# Patient Record
Sex: Female | Born: 1985 | Race: Asian | Hispanic: No | Marital: Married | Smoking: Never smoker
Health system: Southern US, Community
[De-identification: ages and names within clinical notes are randomized; demographics above are authoritative.]

## PROBLEM LIST (undated history)

## (undated) ENCOUNTER — Inpatient Hospital Stay (HOSPITAL_COMMUNITY): Payer: Self-pay

## (undated) DIAGNOSIS — Z789 Other specified health status: Secondary | ICD-10-CM

## (undated) DIAGNOSIS — E119 Type 2 diabetes mellitus without complications: Secondary | ICD-10-CM

## (undated) DIAGNOSIS — B54 Unspecified malaria: Secondary | ICD-10-CM

## (undated) DIAGNOSIS — O24419 Gestational diabetes mellitus in pregnancy, unspecified control: Secondary | ICD-10-CM

## (undated) HISTORY — PX: NO PAST SURGERIES: SHX2092

## (undated) HISTORY — DX: Unspecified malaria: B54

## (undated) HISTORY — DX: Type 2 diabetes mellitus without complications: E11.9

---
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
## 2013-12-26 ENCOUNTER — Ambulatory Visit: Payer: Self-pay | Attending: Family Medicine | Admitting: Family Medicine

## 2013-12-26 ENCOUNTER — Encounter: Payer: Self-pay | Admitting: Family Medicine

## 2013-12-26 VITALS — BP 110/72 | HR 66 | Temp 98.7°F | Resp 16 | Ht <= 58 in | Wt 112.0 lb

## 2013-12-26 DIAGNOSIS — M545 Low back pain, unspecified: Secondary | ICD-10-CM | POA: Insufficient documentation

## 2013-12-26 DIAGNOSIS — IMO0002 Reserved for concepts with insufficient information to code with codable children: Secondary | ICD-10-CM

## 2013-12-26 DIAGNOSIS — L989 Disorder of the skin and subcutaneous tissue, unspecified: Secondary | ICD-10-CM | POA: Insufficient documentation

## 2013-12-26 DIAGNOSIS — S76311A Strain of muscle, fascia and tendon of the posterior muscle group at thigh level, right thigh, initial encounter: Secondary | ICD-10-CM

## 2013-12-26 DIAGNOSIS — Z319 Encounter for procreative management, unspecified: Secondary | ICD-10-CM

## 2013-12-26 DIAGNOSIS — R109 Unspecified abdominal pain: Secondary | ICD-10-CM

## 2013-12-26 DIAGNOSIS — R1030 Lower abdominal pain, unspecified: Secondary | ICD-10-CM

## 2013-12-26 DIAGNOSIS — Z124 Encounter for screening for malignant neoplasm of cervix: Secondary | ICD-10-CM

## 2013-12-26 DIAGNOSIS — D1801 Hemangioma of skin and subcutaneous tissue: Secondary | ICD-10-CM

## 2013-12-26 MED ORDER — ACETAMINOPHEN 500 MG PO TABS: 500.0000 mg | ORAL_TABLET | Freq: Three times a day (TID) | ORAL | Status: DC | PRN

## 2013-12-26 MED ORDER — TRIAMCINOLONE ACETONIDE 0.1 % EX CREA: 1.0000 "application " | TOPICAL_CREAM | Freq: Two times a day (BID) | CUTANEOUS | Status: DC

## 2013-12-26 MED ORDER — PRENATAL VITAMIN 27-0.8 MG PO TABS: 1.0000 | ORAL_TABLET | Freq: Every day | ORAL | Status: DC

## 2013-12-26 NOTE — Assessment & Plan Note (Signed)
A: patient is healthy with healthy habits P: Prenatal vitamin Encouraged continued healthy habits

## 2013-12-26 NOTE — Progress Notes (Signed)
   Subjective:    Patient ID: Claire Hughes, female    DOB: 07/12/1985, 28 y.o.   MRN: [id number] CC: establish care, back pain, lower abdominal pain HPI 28 year old female with no significant past medical history presents to establish care discussed the following:  #1 lower back pain: Patient with low back pain since 2012 after the birth of her last son. Pain is right-sided. Pain radiates to the gluteal area, but not down the leg. There is no associated weakness, fecal or urinary incontinence or dysuria. Pain is exacerbated by running and heavy lifting. No treatment to date.  #2 lower abdominal pain: Patient with lower abdominal pains this was well after the birth of her last month. Pain is exacerbated by running in heavy lifting. Pain is slightly improved since onset. There is fever, no nausea, vomiting, diarrhea, constipation. Patient has a bowel movement daily. Patient is sexually active and desires pregnancy. Her last missed her period was 2 weeks ago and normal.  #3 left foot lesion: Patient with a lesion on her left foot that she noticed after leaving [place]. It is enlarging in size and pruritic. It is not painful. She denies trauma. She has no other lesions on her body.  Soc Hx: chronic non smoker  Review of Systems As per HPI      Objective:   Physical Exam BP 110/72  Pulse 66  Temp(Src) 98.7 F (37.1 C) (Oral)  Resp 16  Ht 4\' 9"  (1.448 m)  Wt 112 lb (50.803 kg)  BMI 24.23 kg/m2  SpO2 98%  LMP 12/13/2013 General appearance: alert, cooperative and no distress Abdomen: soft, non-tender; bowel sounds normal; no masses,  no organomegaly Pelvic: cervix normal in appearance, external genitalia normal, no adnexal masses or tenderness, no cervical motion tenderness, rectovaginal septum normal, uterus normal size, shape, and consistency and vagina normal without discharge Back Exam: Back: Normal Curvature, no deformities or CVA tenderness  Paraspinal Tenderness: absent   LE  Strength 5/5  LE Sensation: in tact  LE Reflexes 2+ and symmetric  Straight leg raise: negative  Skin: angiomata - feet left medial 10 mm x 7 mm      Assessment & Plan:

## 2013-12-26 NOTE — Patient Instructions (Addendum)
Claire Hughes,  Thank you for coming in today. Is a pleasure meeting you. I look forward to being a primary doctor.   The exam is normal. There is no problem with the position of uterus. Your back pain and muscle pain, muscle I suspect is the piriformis muscle. The muscle is strained.  Your abdominal and pelvic exam are normal. There is no hernia.  I I recommend strength training exercises for your abdomen to strengthen the supporting her back. You may take Tylenol as needed for pain if you desire pregnancy. Also be desire pregnancy I recommend starting prenatal vitamins before you  become pregnant.  Please see me as needed for persistent pain or the area on your L foot gets bigger or more itchy. Even with using the steroid cream.   Dr. Adrian Blackwater

## 2013-12-26 NOTE — Assessment & Plan Note (Signed)
A: pruritic lesion of L foot. Enlarging in size. P: Topical steroid cream

## 2013-12-26 NOTE — Assessment & Plan Note (Signed)
Pap and cervical cultures done today.

## 2013-12-26 NOTE — Progress Notes (Signed)
Pt is here to establish care. Pt reports having chronic lower back pain. Pt has a bump on the inside of her left foot.

## 2013-12-26 NOTE — Assessment & Plan Note (Signed)
A: history and exam consistent with performance muscle strain versus sacroiliac joint arthropathy. This is a chronic pain over the past 3 years. There are no red flags. P: Advised [place] exercises and Tylenol as needed for pain since patient desires pregnancy should avoid NSAIDs.

## 2013-12-26 NOTE — Assessment & Plan Note (Signed)
A: Chronic lower abdominal pain with a normal abdominal and pelvic exam. Pain appears to be muscle skeletal. There are no red flags. P: Tylenol as needed for pain recommend a core strengthening exercise.

## 2013-12-27 LAB — CYTOLOGY - PAP

## 2013-12-29 ENCOUNTER — Telehealth: Payer: Self-pay | Admitting: *Deleted

## 2013-12-29 NOTE — Telephone Encounter (Signed)
Pt is aware of her lab results.  

## 2013-12-29 NOTE — Telephone Encounter (Signed)
Message copied by Joan Mayans on Fri Dec 29, 2013  9:20 AM ------      Message from: Boykin Nearing      Created: Thu Dec 28, 2013  9:17 AM       Please inform patient.       Negative pap,      Repeat in 3 years ------

## 2014-03-06 ENCOUNTER — Encounter: Payer: Self-pay | Admitting: Family Medicine

## 2015-03-06 ENCOUNTER — Encounter (HOSPITAL_COMMUNITY): Payer: Self-pay

## 2015-03-06 ENCOUNTER — Inpatient Hospital Stay (HOSPITAL_COMMUNITY): Payer: Medicaid Other

## 2015-03-06 ENCOUNTER — Inpatient Hospital Stay (HOSPITAL_COMMUNITY)
Admission: AD | Admit: 2015-03-06 | Discharge: 2015-03-06 | Disposition: A | Discharge status: Home / Self Care | Payer: Medicaid Other | Source: Ambulatory Visit | Attending: Obstetrics and Gynecology | Admitting: Obstetrics and Gynecology

## 2015-03-06 DIAGNOSIS — O209 Hemorrhage in early pregnancy, unspecified: Secondary | ICD-10-CM

## 2015-03-06 DIAGNOSIS — O200 Threatened abortion: Secondary | ICD-10-CM | POA: Diagnosis present

## 2015-03-06 DIAGNOSIS — Z3A01 Less than 8 weeks gestation of pregnancy: Secondary | ICD-10-CM | POA: Insufficient documentation

## 2015-03-06 LAB — HCG, QUANTITATIVE, PREGNANCY: hCG, Beta Chain, Quant, S: 19112 m[IU]/mL — ABNORMAL HIGH (ref <5)

## 2015-03-06 LAB — CBC
HEMATOCRIT: 32.9 % — AB (ref 36.0–46.0)
Hemoglobin: 11.6 g/dL — ABNORMAL LOW (ref 12.0–15.0)
MCH: 29.2 pg (ref 26.0–34.0)
MCHC: 35.3 g/dL (ref 30.0–36.0)
MCV: 82.9 fL (ref 78.0–100.0)
Platelets: 233 10*3/uL (ref 150–400)
RBC: 3.97 MIL/uL (ref 3.87–5.11)
RDW: 12.6 % (ref 11.5–15.5)
WBC: 7.5 10*3/uL (ref 4.0–10.5)

## 2015-03-06 LAB — WET PREP, GENITAL
Clue Cells Wet Prep HPF POC: NONE SEEN
Trich, Wet Prep: NONE SEEN
YEAST WET PREP: NONE SEEN

## 2015-03-06 LAB — POCT PREGNANCY, URINE: PREG TEST UR: POSITIVE — AB

## 2015-03-06 LAB — ABO/RH: ABO/RH(D): B POS

## 2015-03-06 NOTE — MAU Provider Note (Signed)
History     CSN: [id number]  Arrival date and time: 03/06/15 1711   First Provider Initiated Contact with Patient 03/06/15 1734      Chief Complaint  Patient presents with  . Vaginal Bleeding   HPI  Claire Hughes is a 29 y.o. [id number] at [redacted]w[redacted]d who presents for vaginal bleeding.  vb x 2 weeks. Started out as brown spotting that turned to red and heavier. Today reports light spotting. Denies abdominal pain Denies n/v/d  Speaks Burmese, language line used  OB History    Gravida Para Term Preterm AB TAB SAB Ectopic Multiple Living   4    1  1   2       Past Medical History  Diagnosis Date  . Malaria 1994     as a child in [place]     No past surgical history on file.  Family History  Problem Relation Age of Onset  . Cancer Neg Hx     Social History  Substance Use Topics  . Smoking status: Never Smoker   . Smokeless tobacco: Never Used  . Alcohol Use: No    Allergies: No Known Allergies  Prescriptions prior to admission  Medication Sig Dispense Refill Last Dose  . acetaminophen (TYLENOL) 500 MG tablet Take 1 tablet (500 mg total) by mouth every 8 (eight) hours as needed for moderate pain. 30 tablet 0   . Prenatal Vit-Fe Fumarate-FA (PRENATAL VITAMIN) 27-0.8 MG TABS Take 1 tablet by mouth daily. 90 tablet 1   . triamcinolone cream (KENALOG) 0.1 % Apply 1 application topically 2 (two) times daily. Apply to L foot 30 g 0     Review of Systems  Constitutional: Negative.   Gastrointestinal: Negative.   Genitourinary:       + vaginal bleeding   Physical Exam   Blood pressure 112/64, pulse 87, resp. rate 18, height 4\' 8"  (1.422 m), weight 112 lb 9.6 oz (51.075 kg), last menstrual period 01/27/2015.  Physical Exam  Nursing note and vitals reviewed. Constitutional: She is oriented to person, place, and time. She appears well-developed and well-nourished. No distress.  HENT:  Head: Normocephalic and atraumatic.  Eyes: Conjunctivae are normal. Right eye  exhibits no discharge. Left eye exhibits no discharge. No scleral icterus.  Neck: Normal range of motion.  Cardiovascular: Normal rate, regular rhythm and normal heart sounds.   No murmur heard. Respiratory: Effort normal and breath sounds normal. No respiratory distress. She has no wheezes.  GI: Soft. Bowel sounds are normal. She exhibits no distension. There is no tenderness.  Neurological: She is alert and oriented to person, place, and time.  Skin: Skin is warm and dry. She is not diaphoretic.  Psychiatric: She has a normal mood and affect. Her behavior is normal. Judgment and thought content normal.    MAU Course  Procedures Results for orders placed or performed during the hospital encounter of 03/06/15 (from the past 24 hour(s))  Pregnancy, urine POC     Status: Abnormal   Collection Time: 03/06/15  5:33 PM  Result Value Ref Range   Preg Test, Ur POSITIVE (A) NEGATIVE  ABO/Rh     Status: None (Preliminary result)   Collection Time: 03/06/15  5:34 PM  Result Value Ref Range   ABO/RH(D) B POS   CBC     Status: Abnormal   Collection Time: 03/06/15  5:48 PM  Result Value Ref Range   WBC 7.5 4.0 - 10.5 K/uL   RBC 3.97 3.87 - 5.11 MIL/uL  Hemoglobin 11.6 (L) 12.0 - 15.0 g/dL   HCT 32.9 (L) 36.0 - 46.0 %   MCV 82.9 78.0 - 100.0 fL   MCH 29.2 26.0 - 34.0 pg   MCHC 35.3 30.0 - 36.0 g/dL   RDW 12.6 11.5 - 15.5 %   Platelets 233 150 - 400 K/uL  hCG, quantitative, pregnancy     Status: Abnormal   Collection Time: 03/06/15  5:48 PM  Result Value Ref Range   hCG, Beta Chain, Quant, S 19112 (H) <5 mIU/mL  Wet prep, genital     Status: Abnormal   Collection Time: 03/06/15  6:52 PM  Result Value Ref Range   Yeast Wet Prep HPF POC NONE SEEN NONE SEEN   Trich, Wet Prep NONE SEEN NONE SEEN   Clue Cells Wet Prep HPF POC NONE SEEN NONE SEEN   WBC, Wet Prep HPF POC FEW (A) NONE SEEN   US Ob Comp Less 14 Wks  03/06/2015  CLINICAL DATA:  Vaginal bleeding for 2 weeks EXAM: OBSTETRIC <14  WK [place] AND TRANSVAGINAL OB US TECHNIQUE: Both transabdominal and transvaginal ultrasound examinations were performed for complete evaluation of the gestation as well as the maternal uterus, adnexal regions, and pelvic cul-de-sac. Transvaginal technique was performed to assess early pregnancy. COMPARISON:  None. FINDINGS: Intrauterine gestational sac: Present, oval-shaped, and fundal. Yolk sac:  Present Embryo:  Present Cardiac Activity: Not visualized CRL:  3.8  mm   6 w   One d                  [place] EDC: 10/29/2014 Maternal uterus/adnexae: Ovaries are within normal limits. No pelvic mass. No free-fluid. No evidence of subchorionic hemorrhage. IMPRESSION: A single intrauterine gestation is present within a single normal-appearing gestational sac. The crown-rump length is only 3.8 mm. Fetal heart rate was not visualized. Although this can be a sign of fetal demise, fetal heart rate may not be visible by Doppler sonography until crown-rump length is greater than or equal to 7 mm. Short-term follow-up ultrasound, within 1 week is recommended to ensure normal development of the embryo and development of a fetal heart rate. Serial beta HCG levels are also recommended. Electronically Signed   By: Marybelle Killings M.D.   On: 03/06/2015 18:56   US Ob Transvaginal  03/06/2015  CLINICAL DATA:  Vaginal bleeding for 2 weeks EXAM: OBSTETRIC <14 WK [place] AND TRANSVAGINAL OB US TECHNIQUE: Both transabdominal and transvaginal ultrasound examinations were performed for complete evaluation of the gestation as well as the maternal uterus, adnexal regions, and pelvic cul-de-sac. Transvaginal technique was performed to assess early pregnancy. COMPARISON:  None. FINDINGS: Intrauterine gestational sac: Present, oval-shaped, and fundal. Yolk sac:  Present Embryo:  Present Cardiac Activity: Not visualized CRL:  3.8  mm   6 w   One d                  [place] EDC: 10/29/2014 Maternal uterus/adnexae: Ovaries are within normal limits. No pelvic mass. No  free-fluid. No evidence of subchorionic hemorrhage. IMPRESSION: A single intrauterine gestation is present within a single normal-appearing gestational sac. The crown-rump length is only 3.8 mm. Fetal heart rate was not visualized. Although this can be a sign of fetal demise, fetal heart rate may not be visible by Doppler sonography until crown-rump length is greater than or equal to 7 mm. Short-term follow-up ultrasound, within 1 week is recommended to ensure normal development of the embryo and development of a fetal heart rate.  Serial beta HCG levels are also recommended. Electronically Signed   By: Marybelle Killings M.D.   On: 03/06/2015 18:56    MDM Positive UPT CBC, BHCG, Abo/rh, GC/CT, wet prep Ultrasound - IUP with embryo, no cardiac activity, embryo too small for cardiac activity vs fetal demise. Radiologist recommends f/u ultrasound in 1 week to assess viability B positive Assessment and Plan  A: 1. Vaginal bleeding in pregnancy, first trimester   2. Threatened miscarriage in early pregnancy    P: Discharge home Threatened miscarriage instructions Discussed reasons to return Outpatient ultrasound in 1 week for viability Pregnancy verification letter provided  Jorje Guild, NP  03/06/2015, 5:44 PM

## 2015-03-06 NOTE — Discharge Instructions (Signed)
Threatened Miscarriage  A threatened miscarriage is when you have vaginal bleeding during your first 20 weeks of pregnancy but the pregnancy has not ended. Your doctor will do tests to make sure you are still pregnant. The cause of the bleeding may not be known. This condition does not mean your pregnancy will end. It does increase the risk of it ending (complete miscarriage).  HOME CARE    Make sure you keep all your doctor visits for prenatal care.   Get plenty of rest.   Do not have sex or use tampons if you have vaginal bleeding.   Do not douche.   Do not smoke or use drugs.   Do not drink alcohol.   Avoid caffeine.  GET HELP IF:   You have light bleeding from your vagina.   You have belly pain or cramping.   You have a fever.  GET HELP RIGHT AWAY IF:    You have heavy bleeding from your vagina.   You have clots of blood coming from your vagina.   You have bad pain or cramps in your low back or belly.   You have fever, chills, and bad belly pain.  MAKE SURE YOU:    Understand these instructions.   Will watch your condition.   Will get help right away if you are not doing well or get worse.     This information is not intended to replace advice given to you by your health care provider. Make sure you discuss any questions you have with your health care provider.     Document Released: 04/02/2008 Document Revised: 04/25/2013 Document Reviewed: 02/14/2013  Elsevier Interactive Patient Education 2016 [employer] Inc[employer]

## 2015-03-06 NOTE — MAU Note (Signed)
Patient presents with vaginal bleeding x2 weeks. Went to the clinic today and was told she was pregnant.

## 2015-03-07 LAB — HIV ANTIBODY (ROUTINE TESTING W REFLEX): HIV Screen 4th Generation wRfx: NONREACTIVE

## 2015-03-08 LAB — GC/CHLAMYDIA PROBE AMP (~~LOC~~) NOT AT ARMC
CHLAMYDIA, DNA PROBE: NEGATIVE
Neisseria Gonorrhea: NEGATIVE

## 2015-03-13 ENCOUNTER — Ambulatory Visit (HOSPITAL_COMMUNITY)
Admission: RE | Admit: 2015-03-13 | Discharge: 2015-03-13 | Disposition: A | Discharge status: Home / Self Care | Payer: Medicaid Other | Source: Ambulatory Visit | Attending: Student | Admitting: Student

## 2015-03-13 ENCOUNTER — Inpatient Hospital Stay (HOSPITAL_COMMUNITY)
Admission: AD | Admit: 2015-03-13 | Discharge: 2015-03-13 | Disposition: A | Discharge status: Home / Self Care | Payer: Medicaid Other | Source: Ambulatory Visit | Attending: Family Medicine | Admitting: Family Medicine

## 2015-03-13 DIAGNOSIS — O039 Complete or unspecified spontaneous abortion without complication: Secondary | ICD-10-CM | POA: Diagnosis not present

## 2015-03-13 DIAGNOSIS — O209 Hemorrhage in early pregnancy, unspecified: Secondary | ICD-10-CM

## 2015-03-13 DIAGNOSIS — N939 Abnormal uterine and vaginal bleeding, unspecified: Secondary | ICD-10-CM | POA: Diagnosis present

## 2015-03-13 DIAGNOSIS — O200 Threatened abortion: Secondary | ICD-10-CM

## 2015-03-13 NOTE — Discharge Instructions (Signed)
Miscarriage  A miscarriage is the sudden loss of an unborn baby (fetus) before the 20th week of pregnancy. Most miscarriages happen in the first 3 months of pregnancy. Sometimes, it happens before a woman even knows she is pregnant. A miscarriage is also called a "spontaneous miscarriage" or "early pregnancy loss." Having a miscarriage can be an emotional experience. Talk with your caregiver about any questions you may have about miscarrying, the grieving process, and your future pregnancy plans.  CAUSES    Problems with the fetal chromosomes that make it impossible for the baby to develop normally. Problems with the baby's genes or chromosomes are most often the result of errors that occur, by chance, as the embryo divides and grows. The problems are not inherited from the parents.   Infection of the cervix or uterus.    Hormone problems.    Problems with the cervix, such as having an incompetent cervix. This is when the tissue in the cervix is not strong enough to hold the pregnancy.    Problems with the uterus, such as an abnormally shaped uterus, uterine fibroids, or congenital abnormalities.    Certain medical conditions.    Smoking, drinking alcohol, or taking illegal drugs.    Trauma.   Often, the cause of a miscarriage is unknown.   SYMPTOMS    Vaginal bleeding or spotting, with or without cramps or pain.   Pain or cramping in the abdomen or lower back.   Passing fluid, tissue, or blood clots from the vagina.  DIAGNOSIS   Your caregiver will perform a physical exam. You may also have an ultrasound to confirm the miscarriage. Blood or urine tests may also be ordered.  TREATMENT    Sometimes, treatment is not necessary if you naturally pass all the fetal tissue that was in the uterus. If some of the fetus or placenta remains in the body (incomplete miscarriage), tissue left behind may become infected and must be removed. Usually, a dilation and curettage (D and C) procedure is performed.  During a D and C procedure, the cervix is widened (dilated) and any remaining fetal or placental tissue is gently removed from the uterus.   Antibiotic medicines are prescribed if there is an infection. Other medicines may be given to reduce the size of the uterus (contract) if there is a lot of bleeding.   If you have Rh negative blood and your baby was Rh positive, you will need a Rh immunoglobulin shot. This shot will protect any future baby from having Rh blood problems in future pregnancies.  HOME CARE INSTRUCTIONS    Your caregiver may order bed rest or may allow you to continue light activity. Resume activity as directed by your caregiver.   Have someone help with home and family responsibilities during this time.    Keep track of the number of sanitary pads you use each day and how soaked (saturated) they are. Write down this information.    Do not use tampons. Do not douche or have sexual intercourse until approved by your caregiver.    Only take over-the-counter or prescription medicines for pain or discomfort as directed by your caregiver.    Do not take aspirin. Aspirin can cause bleeding.    Keep all follow-up appointments with your caregiver.    If you or your partner have problems with grieving, talk to your caregiver or seek counseling to help cope with the pregnancy loss. Allow enough time to grieve before trying to get pregnant again.     SEEK IMMEDIATE MEDICAL CARE IF:    You have severe cramps or pain in your back or abdomen.   You have a fever.   You pass large blood clots (walnut-sized or larger) ortissue from your vagina. Save any tissue for your caregiver to inspect.    Your bleeding increases.    You have a thick, bad-smelling vaginal discharge.   You become lightheaded, weak, or you faint.    You have chills.   MAKE SURE YOU:   Understand these instructions.   Will watch your condition.   Will get help right away if you are not doing well or get worse.     This  information is not intended to replace advice given to you by your health care provider. Make sure you discuss any questions you have with your health care provider.     Document Released: 10/14/2000 Document Revised: 08/15/2012 Document Reviewed: 06/09/2011  Elsevier Interactive Patient Education 2016 [employer]

## 2015-03-13 NOTE — MAU Provider Note (Signed)
Chief Complaint: No chief complaint on file.   First Provider Initiated Contact with Patient 03/13/15 1551      SUBJECTIVE HPI: Claire Hughes is a 29 y.o. [id number] at 51w3dby LMP who presents to maternity admissions for results of her ultrasound followup.  States had heavy bleeding the day after her last visit.  Lasted for about a day then got better. Now only bleeding a little.  She denies pain, vaginal itching/burning, urinary symptoms, h/a, dizziness, n/v, or fever/chills.     HPI  Past Medical History  Diagnosis Date  . Malaria 1994     [place]   No past surgical history on file. Social History   Social History  . Marital Status: Married    Spouse Name: N/A  . Number of Children: 2  . Years of Education: N/A   Occupational History  . Not on file.   Social History Main Topics  . Smoking status: Never Smoker   . Smokeless tobacco: Never Used  . Alcohol Use: No  . Drug Use: No  . Sexual Activity: Not Currently     Comment: previous used pill and depo   Other Topics Concern  . Not on file   Social History Narrative   [place]    Has two children, boys.   Married   Desires pregnancy            No current facility-administered medications on file prior to encounter.   Current Outpatient Prescriptions on File Prior to Encounter  Medication Sig Dispense Refill  . acetaminophen (TYLENOL) 500 MG tablet Take 1 tablet (500 mg total) by mouth every 8 (eight) hours as needed for moderate pain. 30 tablet 0  . Prenatal Vit-Fe Fumarate-FA (PRENATAL VITAMIN) 27-0.8 MG TABS Take 1 tablet by mouth daily. 90 tablet 1   No Known Allergies  ROS:  Review of Systems Review of Systems  Constitutional: Negative for fever and chills.  Gastrointestinal: Negative for nausea, vomiting, abdominal pain, diarrhea and constipation.  Genitourinary: Negative for dysuria. Small bleeding Musculoskeletal: Negative for back pain.   Neurological: Negative for dizziness and weakness.    I have reviewed patient's Past Medical Hx, Surgical Hx, Family Hx, Social Hx, medications and allergies.   Physical Exam  No data found.  Constitutional: Well-developed, well-nourished female in no acute distress.  Cardiovascular: normal rate Respiratory: normal effort GI: Abd soft, non-tender. Pos BS x 4 MS: Extremities nontender, no edema, normal ROM Neurologic: Alert and oriented x 4.  GU: Neg CVAT.   Physical Exam LAB RESULTS No results found for this or any previous visit (from the past 24 hour(s)).  --/--/B POS (11/02 1734)  IMAGING [place] Comp Less 14 Wks  03/06/2015  CLINICAL DATA:  Vaginal bleeding for 2 weeks EXAM: OBSTETRIC <14 WK [place] TRANSVAGINAL OB [place]CHNIQUE: Both transabdominal and transvaginal ultrasound examinations were performed for complete evaluation of the gestation as well as the maternal uterus, adnexal regions, and pelvic cul-de-sac. Transvaginal technique was performed to assess early pregnancy. COMPARISON:  None. FINDINGS: Intrauterine gestational sac: Present, oval-shaped, and fundal. Yolk sac:  Present Embryo:  Present Cardiac Activity: Not visualized CRL:  3.8  mm   6 w   One d                  UKoreaEDC: 10/29/2014 Maternal uterus/adnexae: Ovaries are within normal limits. No pelvic mass. No free-fluid. No evidence of subchorionic hemorrhage. IMPRESSION:  A single intrauterine gestation is present within a single normal-appearing gestational sac. The crown-rump length is only 3.8 mm. Fetal heart rate was not visualized. Although this can be a sign of fetal demise, fetal heart rate may not be visible by Doppler sonography until crown-rump length is greater than or equal to 7 mm. Short-term follow-up ultrasound, within 1 week is recommended to ensure normal development of the embryo and development of a fetal heart rate. Serial beta HCG levels are also recommended. Electronically Signed   By: Marybelle Killings  M.D.   On: 03/06/2015 18:56   US Ob Transvaginal  03/13/2015  CLINICAL DATA:  Persistent vaginal bleeding EXAM: TRANSVAGINAL OB ULTRASOUND TECHNIQUE: Transvaginal ultrasound was performed for complete evaluation of the gestation as well as the maternal uterus, adnexal regions, and pelvic cul-de-sac. COMPARISON:  March 06, 2015 FINDINGS: Intrauterine gestational sac: No longer visualized Yolk sac:  Not visualized Embryo:  Not visualized Maternal uterus/adnexae: There is a small amount of fluid within the endometrium, likely hemorrhage. No retained products of conception seen. Uterus otherwise appears normal. There is no maternal extrauterine pelvic or adnexal mass. Right ovary measures 3.0 x 3.4 x 1.4 cm. Left ovary measures 2.6 x 1.9 x 3.3 cm. There is trace free pelvic fluid. IMPRESSION: Findings consistent with recent spontaneous abortion. Gestational sac is no longer appreciable. Probable mild hemorrhage within the endometrium. Uterus and endometrium otherwise unremarkable appearance. Trace free pelvic fluid is likely physiologic. There is no extrauterine pelvic mass. Electronically Signed   By: Lowella Grip III M.D.   On: 03/13/2015 15:38   US Ob Transvaginal  03/06/2015  CLINICAL DATA:  Vaginal bleeding for 2 weeks EXAM: OBSTETRIC <14 WK [place] AND TRANSVAGINAL OB US TECHNIQUE: Both transabdominal and transvaginal ultrasound examinations were performed for complete evaluation of the gestation as well as the maternal uterus, adnexal regions, and pelvic cul-de-sac. Transvaginal technique was performed to assess early pregnancy. COMPARISON:  None. FINDINGS: Intrauterine gestational sac: Present, oval-shaped, and fundal. Yolk sac:  Present Embryo:  Present Cardiac Activity: Not visualized CRL:  3.8  mm   6 w   One d                  [place] EDC: 10/29/2014 Maternal uterus/adnexae: Ovaries are within normal limits. No pelvic mass. No free-fluid. No evidence of subchorionic hemorrhage. IMPRESSION: A single  intrauterine gestation is present within a single normal-appearing gestational sac. The crown-rump length is only 3.8 mm. Fetal heart rate was not visualized. Although this can be a sign of fetal demise, fetal heart rate may not be visible by Doppler sonography until crown-rump length is greater than or equal to 7 mm. Short-term follow-up ultrasound, within 1 week is recommended to ensure normal development of the embryo and development of a fetal heart rate. Serial beta HCG levels are also recommended. Electronically Signed   By: Marybelle Killings M.D.   On: 03/06/2015 18:56    MAU Management/MDM: Reviewed ultrasound results.   Consult Dr Kennon Rounds.  No need for further HCG levels or ultrasounds  Pt stable at time of discharge. Will have clinic call her for post-SAB followup visit  Comfort kit given  ASSESSMENT 1. SAB (spontaneous abortion)     PLAN Discharge home   Medication List    TAKE these medications        acetaminophen 500 MG tablet  Commonly known as:  TYLENOL  Take 1 tablet (500 mg total) by mouth every 8 (eight) hours as needed for moderate  pain.     Prenatal Vitamin 27-0.8 MG Tabs  Take 1 tablet by mouth daily.           Follow-up Information    Follow up with [facility].   Why:  Someone from clinic will call with appointment   Contact information:   [place] [employer], MSN Certified Nurse-Midwife 03/13/2015  4:08 PM

## 2015-04-04 ENCOUNTER — Encounter: Payer: Self-pay | Admitting: Student

## 2015-04-04 ENCOUNTER — Ambulatory Visit (INDEPENDENT_AMBULATORY_CARE_PROVIDER_SITE_OTHER): Payer: Medicaid Other | Admitting: Student

## 2015-04-04 VITALS — BP 108/68 | HR 68 | Temp 98.5°F | Ht <= 58 in | Wt 111.9 lb

## 2015-04-04 DIAGNOSIS — O039 Complete or unspecified spontaneous abortion without complication: Secondary | ICD-10-CM | POA: Diagnosis present

## 2015-04-04 NOTE — Progress Notes (Signed)
Subjective:     Patient ID: Claire Hughes, female   DOB: 07/12/1985, 29 y.o.   MRN: [id number]  HPI Claire Hughes is a 29 y.o. female who presents for f/u SAB. Had complete SAB on 11/9. Denies abdominal pain, fever, or vaginal bleeding since last visit in MAU on 11/9.  Patient desires pregnancy. Has had 2 successful pregnancies & 2 miscarriages.   Review of Systems  Constitutional: Negative.   Gastrointestinal: Negative.   Genitourinary: Negative.        Objective:   Physical Exam  Constitutional: She appears well-developed and well-nourished. No distress.  Cardiovascular: Normal rate, regular rhythm and normal heart sounds.   Pulmonary/Chest: Effort normal and breath sounds normal. No respiratory distress. She has no wheezes.  Abdominal: Soft. Bowel sounds are normal. She exhibits no distension. There is no tenderness.  Skin: She is not diaphoretic.  Psychiatric: She has a normal mood and affect. Her behavior is normal. Judgment and thought content normal.   BP 108/68 mmHg  Pulse 68  Temp(Src) 98.5 F (36.9 C)  Ht 4\' 7"  (1.397 m)  Wt 111 lb 14.4 oz (50.758 kg)  BMI 26.01 kg/m2  LMP 03/27/2015 (Approximate)  Breastfeeding? No      Assessment:     Patient is in good health & spirits today. Expresses desire for future pregnancy. Answered all patient's questions through [place] interpreter at the bedside. Will collect BHCG to ensure that is has returned to normal. Patient denies intercourse since miscarriage.      Plan:     1. Spontaneous miscarriage  - B-HCG Quant      Patient to f/u as needed for routine gyn care.   Jorje Guild, NP

## 2015-04-04 NOTE — Patient Instructions (Signed)
S?y [place] (Miscarriage) S?y thai l [place] nhi ??t ng?t b? m?t tr??c tu?n th? 20 c?a [place] k?. H?u h?t cc tr??ng h?p s?y [place] x?y ra trong 3 thng ??u c?a [place] k?. ?i khi, n x?y ra tr??c khi ng??i ph? n? bi?t mnh c [place]. S?y thai cn ???c g?i l "s?y [place] t? nhin" hay "s?y thai s?m". S?y thai c th? ?nh h??ng ??n tnh c?m. Ni chuy?n v?i chuyn gia ch?m [place] s?c kh?e c?a b?n v? b?t k? cu h?i m b?n c th? c v? s?y [place], qu trnh ?au bu?n v k? ho?ch mang thai trong t??ng lai. NGUYN NHN  V?n ?? v?i cc nhi?m s?c th? c?a [place] nhi khi?n cho thai nhi khng th? pht tri?n bnh th??ng. V?n ?? v?i gen ho?c nhi?m s?c th? c?a em b th??ng l k?t qu? c?a cc sai st v tnh x?y ra khi phi thai phn chia v pht tri?n. Nh?ng v?n ?? ny khng ???c di truy?n t? cha m?.  Nhi?m trng c? t? cung ho?c t? cung.  V?n ?? v? hocmon.  V?n ?? v?i c? t? cung, ch?ng h?n nh? c c? t? cung y?u. ?i?u ny x?y ra khi m ? c? t? cung khng ?? m?nh ?? gi? [place].  V?n ?? v?i t? cung, ch?ng h?n nh? t? cung c hnh d?ng d? th??ng, u x? t? cung ho?c d? t?t b?m sinh.  M?t s? tnh tr?ng b?nh l nh?t ??nh.  Ht thu?c l, u?ng r??u ho?c dng ma ty b?t h?p php.  Ch?n th??ng. Nguyn nhn s?y thai th??ng khng xc ??nh ???c. TRI?U CH?NG  Ch?y mu ho?c ra mu t ? m ??o, b? ho?c khng b? co th?t ho?c ?au.  ?au ho?c co th?t trong b?ng ho?c ph?n l?ng d??i.  Ch?y d?ch, m ho?c c?c mu ?ng t? m ??o. CH?N ?ON Chuyn gia ch?m [place] s?c kh?e s? khm th?c th?. B?n c?ng c th? ???c siu m ?? xc nh?n s?y [place]. Xt nghi?m mu ho?c n??c ti?u c?ng c th? ???c yu c?u. ?I?U TR?  ?i khi, khng c?n ?i?u tr? n?u m bo [place] trong t? cung thot ra ngoi m?t cch t? nhin. N?u m?t ph?n bo [place] ho?c nhau thai v?n cn trong c? th? (s?y thai khng hon ton), m cn l?i c th? b? nhi?m trng v c?n ph?i ???c lo?i b?. Thng th??ng, th? thu?t nong v n?o (D v C) ???c th?c hi?n. Trong th? thu?t D v C, c? t? cung ???c m? r?ng (lm gin  ra) v m?i m bo [place] ho?c nhau thai cn l?i ???c nh? nhng l?y ra kh?i t? cung.  Thu?c khng sinh ???c k ??n n?u c nhi?m trng. Cc lo?i thu?c khc c th? ???c cho dng ?? gi?m kch th??c c?a t? cung (co l?i) n?u ch?y r?t nhi?u mu.  N?u mu b?n m tnh v?i Rh v em b c?a b?n d??ng tnh v?i Rh, b?n s? c?n tim globulin mi?n d?ch Rh. M?i tim ny s? b?o v? m?i em b c?a b?n trong t??ng lai khng b? cc v?n ?? v? mu Rh trong cc l?n mang thai trong t??ng lai. H??NG D?N CH?M [place] T?I NH  Chuyn gia ch?m [place] s?c kh?e c th? yu c?u b?n ngh? ng?i t?i gi??ng ho?c c th? cho php b?n ti?p t?c ho?t ??ng nh?Marland Kitchen Ti?p t?c ho?t ??ng theo ch? d?n c?a chuyn gia ch?m [place] s?c kh?e.  Nh? ai ? gip gi?i quy?t  cc trch nhi?m gia ?nh v vi?c nh trong th?i gian ny.  Theo di s? l??ng b?ng v? sinh b?n s? d?ng m?i ngy v m?c ?? ng?m (th?m ??m)c?a chng. Ghi l?i thng tin ny.  Khng dng cu?n b?ng v? sinh. Khng th?t r?a m ??o ho?c quan h? tnh d?c cho ??n khi ???c ch?p thu?n b?i chuyn gia ch?m [place] s?c kh?e.  Ch? s? d?ng thu?c khng c?n k toa ho?c thu?c c?n k toa ?? gi?m ?au ho?c gi?m c?m gic kh ch?u theo ch? d?n c?a chuyn gia ch?m [place] s?c kh?e c?a b?n.  Khng dng aapirin. Aspirin c th? gy ch?y mu.  Tun th? t?t c? cc cu?c h?n khm l?i v?i chuyn gia ch?m [place] s?c kh?e c?a b?n.  N?u b?n ho?c b?n tnh c?a b?n ?au bu?n qu m?c, hy ni chuy?n v?i chuyn gia ch?m [place] s?c kh?e c?a b?n ho?c tm ki?m s? t? v?n ?? gip ??i ph v?i vi?c s?y [place]. Cho php ?? th?i gian ?? s? ?au bu?n v?i ?i tr??c khi c? g?ng c thai l?i. HY NGAY L?P T?C ?I KHM N?U:  B?n b? co th?t n?ng ho?c ?au n?ng ? l?ng ho?c b?ng.  B?n b? s?t.  C c?c ?ng mu (kch th??c b?ng h?t d? ho?c l?n h?n) ho?c m thot ra t? m ??o c?a b?n. Gi? l?i b?t c? m no ?? chuyn gia ch?m [place] s?c kh?e c?a b?n ki?m tra.  B?n b? ch?y mu gia t?ng.  C d?ch ??c, n?ng mi ch?y ra t? m ??o c?a b?n.  B?n b? ?au ??u, y?u ho?c b? ng?t.  B?n  b? ?n l?nh. ??M B?O B?N:  Hi?u cc h??ng d?n ny.  S? theo di tnh tr?ng c?a mnh.  S? yu c?u tr? gip ngay l?p t?c n?u b?n c?m th?y khng ?? ho?c tnh tr?ng tr?m tr?ng h?n.   Thng tin ny khng nh?m m?c ?ch thay th? cho l?i khuyn m chuyn gia ch?m [place] s?c kh?e ni v?i qu v?. Hy b?o ??m qu v? ph?i th?o lu?n b?t k? v?n ?? g m qu v? c v?i chuyn gia ch?m [place] s?c kh?e c?a qu v?.   Document Released: 01/28/2005 Document Revised: 12/21/2012 Elsevier Interactive Patient Education [employer].

## 2015-04-05 LAB — HCG, QUANTITATIVE, PREGNANCY: HCG, BETA CHAIN, QUANT, S: 2.7 m[IU]/mL

## 2015-04-08 ENCOUNTER — Telehealth: Payer: Self-pay | Admitting: General Practice

## 2015-04-08 NOTE — Telephone Encounter (Signed)
Per Jorje Guild, patient's bhcg has returned to normal and needs no further follow up. Patient can return to clinic for routine gyn care or future pregnancies if desired. Called patient with [place] interpreter [phone] and informed her of results & recommendations. Patient verbalized understanding & had no questions

## 2015-10-16 DIAGNOSIS — O039 Complete or unspecified spontaneous abortion without complication: Secondary | ICD-10-CM

## 2015-10-22 NOTE — Congregational Nurse Program (Signed)
Congregational Nurse Program Note  Date of Encounter: 10/16/2015  Past Medical History: Past Medical History  Diagnosis Date  . Malaria 1994     as a child in [place]     Encounter Details:     CNP Questionnaire - 10/22/15 1503    Patient Demographics   Is this a new or existing patient? New   Patient is considered a/an Refugee   Race Asian   Patient Assistance   Location of Patient Assistance Not Applicable   Patient's financial/insurance status Medicaid;Low Income   Uninsured Patient Yes   Interventions Counseled to make appt. with provider;Assisted patient in making appt.   Patient referred to apply for the following financial assistance Not Applicable   Food insecurities addressed Not Applicable   Transportation assistance No   Assistance securing medications No   Educational health offerings Navigating the healthcare system   Encounter Details   Primary purpose of visit [place]   Was an Emergency Department visit averted? Not Applicable   Does patient have a medical provider? No   Patient referred to Establish PCP   Was a mental health screening completed? (GAINS tool) No   Does patient have dental issues? No   Does patient have vision issues? No   Does your patient have an abnormal blood pressure today? No   Since previous encounter, have you referred patient for abnormal blood pressure that resulted in a new diagnosis or medication change? No   Does your patient have an abnormal blood glucose today? No   Since previous encounter, have you referred patient for abnormal blood glucose that resulted in a new diagnosis or medication change? No   Was there a life-saving intervention made? No     Office visit for this [place]ese speaking lady seeking assistance in locating a PCP and Gyn for care. Through interpreter, expressed concern about having gyn exam after miscarriage. Also described growth on left outer anterior surface of foot. Lesion  approximately size of dime, itching, moveable and non-tender. Present several years without pain. Unable to locate provider accepting Medicaid for gyn care today.[facility] recommended on Medicaid info. Return to office 10/22/15 for appointments. Jannetta Quint, RN/CN.

## 2016-08-25 ENCOUNTER — Ambulatory Visit (INDEPENDENT_AMBULATORY_CARE_PROVIDER_SITE_OTHER): Payer: Self-pay | Admitting: Internal Medicine

## 2016-08-25 ENCOUNTER — Encounter: Payer: Self-pay | Admitting: Internal Medicine

## 2016-08-25 VITALS — BP 100/60 | HR 78 | Resp 12 | Ht <= 58 in | Wt 114.0 lb

## 2016-08-25 DIAGNOSIS — L989 Disorder of the skin and subcutaneous tissue, unspecified: Secondary | ICD-10-CM

## 2016-08-25 DIAGNOSIS — S6701XA Crushing injury of right thumb, initial encounter: Secondary | ICD-10-CM

## 2016-08-25 NOTE — Patient Instructions (Signed)
Salicylic acid plasters--cut to fit foot lesion--change daily after bathing.

## 2016-08-25 NOTE — Progress Notes (Signed)
   Subjective:    Patient ID: Claire Hughes, female    DOB: 07/12/1985, 31 y.o.   MRN: [id number]  HPI   Here to establish  1.  Left foot with lump:  Instep of left foot.  Had this evaluated about 1 year ago.  Not clear what was done.  Perhaps something topically.  Has had the lesion for years.  Gets bigger at times and can itch--the latter can keep her awake at night.    2.  Closed her right thumb in the car door one month ago.  Swelling and pressure still.  Good function, however.  No outpatient prescriptions have been marked as taking for the 08/25/16 encounter (Office Visit) with Mack Hook, MD.    No Known Allergies   Past Medical History:  Diagnosis Date  . Malaria 1994   as a child in [place]     No past surgical history on file.   Family History  Problem Relation Age of Onset  . Cancer Neg Hx     Social History   Social History  . Marital status: Married    Spouse name: Glendora Score  . Number of children: 2  . Years of education: 6   Occupational History  . Housewife    Social History Main Topics  . Smoking status: Never Smoker  . Smokeless tobacco: Never Used  . Alcohol use No  . Drug use: No  . Sexual activity: Not Currently     Comment: previous used pill and depo   Other Topics Concern  . Not on file   Social History Narrative   Born in [place] for 6 years   [place]    Lives with husband, two sons, an uncle and Gwyndolyn Saxon Tintuep         Review of Systems     Objective:   Physical Exam  Right thumb:  Tender mildly at medial joint line --mainly on distal proximal phalanx.  Minimally swelling of the joint.  Full ROM and apparent function.  No deformity.  Good cap refill.  Left instep:  1 cm smooth shiny clear domed thickened lesion with dark center deep to dome.  Not attached to structures below skin.        Assessment & Plan:  1.  Crush injury to right thumb with no apparent  deformity and full function.  Would not recommend any intervention.  Follow for now.  2.  Lesion of left instep:  Hard to tell what this was at one time.  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  Will use 82% salicylic acid plasters to area with follow up in one week.  To reapply daily after bathing.

## 2016-09-02 ENCOUNTER — Ambulatory Visit: Payer: Self-pay | Admitting: Internal Medicine

## 2016-09-15 ENCOUNTER — Ambulatory Visit (INDEPENDENT_AMBULATORY_CARE_PROVIDER_SITE_OTHER): Payer: Self-pay | Admitting: Internal Medicine

## 2016-09-15 ENCOUNTER — Encounter: Payer: Self-pay | Admitting: Internal Medicine

## 2016-09-15 VITALS — BP 102/70 | HR 70 | Resp 12 | Ht <= 58 in | Wt 112.0 lb

## 2016-09-15 DIAGNOSIS — L989 Disorder of the skin and subcutaneous tissue, unspecified: Secondary | ICD-10-CM

## 2016-09-15 MED ORDER — SALICYLIC ACID 40 % EX MISC: CUTANEOUS | 0 refills | Status: DC

## 2016-09-15 NOTE — Progress Notes (Signed)
   Subjective:    Patient ID: Claire Hughes, female    DOB: 07/12/1985, 31 y.o.   MRN: [id number]  HPI   Using Mediplast intermittently.  Reportedly, the lesion on her foot was almost gone last week.  Nothing came out of the center, which was dark previously.  Patient calls friend who is bilingual in [place] and [place]:  Sui. Sounds like patient felt the lesion was better, so she stopped using the [place] in past 2 days.   Discussed through her friend the lesion needs replacement of the plaster every time she removes the old on a daily basis.  Have discussed replacing after bathing daily.   Patient thought she needed a pregnancy test, but becomes clear she was applying for a job and was asked on a form if she is pregnant.  She does not feel like she is pregnant.  Meds:  Mediplast plasters to apply to affected area daily  No Known Allergies    Review of Systems     Objective:   Physical Exam  1 cm shiny smooth domed lesion instep of left foot.  No central darkness as before.      Assessment & Plan:  Lesion, left foot instep: discussed to continue using the plaster without any breaks until lesion is completely removed.  She seems to understand.  3 samples given. Has not obtained orange card yet as husband's work information not yet obtained.  Will hold on followup as long as Elesa Hacker is checking on lesion.

## 2016-09-15 NOTE — Patient Instructions (Signed)
To check with [employer], with whom she is already working about answering the pregnancy question for a job application.  Discussed this is likely illegal.

## 2016-10-23 ENCOUNTER — Encounter (HOSPITAL_COMMUNITY): Payer: Self-pay | Admitting: *Deleted

## 2016-10-23 ENCOUNTER — Inpatient Hospital Stay (HOSPITAL_COMMUNITY)
Admission: AD | Admit: 2016-10-23 | Discharge: 2016-10-23 | Disposition: A | Discharge status: Home / Self Care | Payer: Medicaid Other | Source: Ambulatory Visit | Attending: Family Medicine | Admitting: Family Medicine

## 2016-10-23 ENCOUNTER — Inpatient Hospital Stay (HOSPITAL_COMMUNITY): Payer: Medicaid Other

## 2016-10-23 DIAGNOSIS — O200 Threatened abortion: Secondary | ICD-10-CM | POA: Insufficient documentation

## 2016-10-23 DIAGNOSIS — Z3A09 9 weeks gestation of pregnancy: Secondary | ICD-10-CM | POA: Insufficient documentation

## 2016-10-23 DIAGNOSIS — O209 Hemorrhage in early pregnancy, unspecified: Secondary | ICD-10-CM | POA: Diagnosis not present

## 2016-10-23 DIAGNOSIS — O26891 Other specified pregnancy related conditions, first trimester: Secondary | ICD-10-CM

## 2016-10-23 DIAGNOSIS — R109 Unspecified abdominal pain: Secondary | ICD-10-CM

## 2016-10-23 LAB — CBC
HCT: 36.4 % (ref 36.0–46.0)
HEMOGLOBIN: 13.2 g/dL (ref 12.0–15.0)
MCH: 30.7 pg (ref 26.0–34.0)
MCHC: 36.3 g/dL — ABNORMAL HIGH (ref 30.0–36.0)
MCV: 84.7 fL (ref 78.0–100.0)
Platelets: 235 10*3/uL (ref 150–400)
RBC: 4.3 MIL/uL (ref 3.87–5.11)
RDW: 12.6 % (ref 11.5–15.5)
WBC: 6.4 10*3/uL (ref 4.0–10.5)

## 2016-10-23 LAB — WET PREP, GENITAL
CLUE CELLS WET PREP: NONE SEEN
Sperm: NONE SEEN
Trich, Wet Prep: NONE SEEN
YEAST WET PREP: NONE SEEN

## 2016-10-23 LAB — URINALYSIS, ROUTINE W REFLEX MICROSCOPIC
BACTERIA UA: NONE SEEN
BILIRUBIN URINE: NEGATIVE
Glucose, UA: NEGATIVE mg/dL
KETONES UR: NEGATIVE mg/dL
LEUKOCYTES UA: NEGATIVE
NITRITE: NEGATIVE
PROTEIN: NEGATIVE mg/dL
Specific Gravity, Urine: 1.005 (ref 1.005–1.030)
pH: 7 (ref 5.0–8.0)

## 2016-10-23 LAB — POCT PREGNANCY, URINE: Preg Test, Ur: POSITIVE — AB

## 2016-10-23 LAB — HCG, QUANTITATIVE, PREGNANCY: HCG, BETA CHAIN, QUANT, S: 23659 m[IU]/mL — AB (ref <5)

## 2016-10-23 NOTE — Discharge Instructions (Signed)
?????????????????????????? ??????????????????????????????????????????????????????????????????????????????????????????????????? 20 ??????????????????????????????????????????????????????? ??????????????????????????????????????????????????????????????????????????????????????????????????????????????????????????????? ???????????????????????????????????????????????????????? (???????) ???????????????????????? (????????) ????????????????????????????????????????????????????????????, ?????????????????? ?????????? ??????????????????????????????????????????????????????????????????????????????, ?????????????????????????? (??????????????????????) ??????????????????????????????????????? ?????????????????????????????? ?????????????????????????????????????????????????????????????????????????? ??????????????????????????????????????????????????, ???????????????????????????????????????????????????????????????????????????????????????????????? ???????????????????????????????????????????????????????????????????????????????????????????? ???????????????????????????????????????????????????????????????????: ????????????? ????????????????????????????? ???????????????????????????????????? ???????????????????????????????????? implants ????????????????????????  ??????????????????????????? ?????????????????????????????????????????????????????????????????: ???????????? ????????????? ????????????????????????????????????????????  Recreational ???????????????????????  ??????????????????????????????????????????????????? ???????????????????????? ????????????????????????????????????????????? ????????????????? ?????????????? 20 ?????????????????????????????????????????????????????????????????????, ??????????????????????????????????????????????????????????????????????????????????????????? ???????????????????????????????????????????????????????????????????????????????????????????????????????????? (ultrasound) ??? ????????.  ????????? ??????????????????????????????????????????????????? (??????????????????????) ?????????????????????????????????????????????????????????????????????????????????? ????????????????????????????????????????????????????????:  Ultrasound ???????????????????????????????????????? ????????????????????????????????????? ??????????????????????????????????????????????????????????????????? (?????????????) ???????????????????????????????????????? ????????????????????????????????????????????????????? ??????????????????????????????????????????????  ?????????????????? ???????????????????????????????????????????????????????????????????????????????????????????????????????????????? ???????????????????????????????????????????????? ??????????????????????????????????: ??????????????????????????????????????????????????????????????????????????????????? ??????????????????????? ???????????????????????????????? ?????????????????????????????????????????????? tampons ???????????  douche ?????????? ???????????????????????????????????????????????????????????? ???????????????????? ?????????????????????? ???????????????????????????????????????????????: ??????????????????????????????????????????????????????????????????????? ???????????????????????????????????????????????? ???????????????? ????????????????????????????: ??????????????????????????????????????????? ????????????????????????????????? ????????????????????????????: ??????????????????????????????????????????? ???????????????????????????????????? ???????????????????????????????????????????????????????????????? ?????????, ??????????????, ????????????????????????????????????   hkyaaimhkyawwat kowaanpyetkya s ngya s nyya aain gar jarat swayhtwat myarr saeet aahkar tait u k hkyaaimhkyawwat kowaan pyetkya kowaan saint rae pahtam u sone aayout 20 paat kyaar karlaaatwin hpyitpaw paymaae kowaan aasonesaat m htarr parbhuu . s ngya s nyya i  aahkyane karlaaatwin aain gar jarat swayhtwat shipark saineat kyannmarrayyhcaungshoutmhu payy suu sayhkyaar sain sell kowaansaung hcay hcamsautmhu pyu lim mai . aasopar hcamsautmhu myarr sain sell [employer] nhaint saint a mi wamhtell ( sarr aain) aatwinshi hpwanhpyaoe sellkalayy ( san dhay sarr) tone pell saint rae a hkw aay nay tae hkyaaimhkyawwat kowaan pyetkya hcain hcarr sai, kyeehtwarrlar naypartaal . pya s nayshin tait u k hkyaaimhkyawwat kowaan pyetkya saint rae kowaan ko aasonesaat par lain Interior and spatial designer, darpaymaae suu k saint raekowaan ( pyi hcone kowaan pyetkya) soneshonemhu aantararalko toepwarr hcay parbhuu . aasopar aakyaunggtararrmyarr bhartwaylell? tait hkyaaimhkyawwat kowaan pyetkya hkyinn eat aakyaunggrainnmhar a myarraarr hpyint si sai mahote . sain tait u pyi hcone kowaan pyetkya shisai paw swarr shin, aasoneaamyarrsone aakyaunggmashi hpwanhpyaoe sell k layyaatwat hk ro mo sum taithku ponemhaanmahotesaw aarayaatwat hpyitpartaal . hk ro mo jone aarrlone saint rae myoeroebej pahchcaee ko kinehtarr selll aatwinshi aasouta ue myarr hpyitkyasai . kowaan pyetkyamhu m aain gar jarat swayhtwat eat aahkyahoet aakyaunggtararrmyarr parwainsai:  liin shihkyinn .  aanaynae rawgar kuuhcaat htarr shihkyinn .  kowaan ponemhaan hawmone aapyaunggaalell .  saineat a sarr aainaatwin saeet aahkar tait ukyaatu implants hpyitpaw kyaungg swayhtwat .  a bhaalaarar ko aantararal toe pwarr? [name] aatwat swayhtwat myarraatwat aantararal aahkyetmyarr parwainsai:  aawalwanhkyinn .  sayyliutsout .  a raat shoetmahote k hpein dharat eat aalwanaakyawan pamarn sout .  Recreational muuyaitsayywarr sonehcwalmhu .  nimate lakhkanar thoetko shoetmahote rawgar lakhkanartway k bhartwaylell?  aalainn aain gar jarat swayhtwat .  a pyaww hcarr wambite narkyinmhu shoetmahote kyawat taat . bhaallo de rawgar salell? s ngya s nyyakowaan 20 paat kyaar matineme wambite narkyinmhu nhaint aatuu  shoetmahote maparbhell swayhtwat kya shin, saineat kyannmarrayyhcaungshoutmhu payy suu sain sell kowaansaung shimashi hcaitsayy hcamsautmhu myarr pyu lim mai . taithkumhar aarayykyeesaw hcamsautmhu saint rae sarr aain eat aatwinpine eat roteponemyarr ko hpaantee raan aasan lhaine myarr nhaintkwanpyauutar (ultrasound) ko aasonepyu. parwangya s nyya . aahkwarr saw [employer] k saineat liin aain gar myarr nhaint sarraain ( tain parr sone twin hcarmayypwal) nhaint saint kalayyeat nhalonehkonenhuann eat tinetarhkyinn taithku pyitwinrayy hcarmayypwaltwin parwainsai . s ngya s nyya sopar k tait hkyaaimhkyawwat kowaan pyetkya rawgar hcay hkyinn nghaar:  Ultrasound hcamsauthkyinn sain sell kowaansaung pya s htarrtaal .  saineat a kalayyeat nhalonehkonenhuann hkinemar taal .  tait u tain parr sone twin hcarmayypwal hpyay so saint rae sarr aain nhaint saint liin aaingar ( sarraainhkaungg) a kyarr hpw int lhait tanhkarrpate kyaungg pya s htarrtaal .  saineat a nhalonehkonenhuann nhaint sway hpiaarr taingyaain hpyitkyasai .  sway hcamsautmhu myarr sain sell kowaansaung aataipyu par .  de ko bhaallo kus salell? a bhaalsuu myaha m kus nay tae pyi hcone kowaan pyetkya hphoet swarr rar mha tait u hkyaaimhkyawwat  kowaan pyetkya karkwal tarrsee hphoet pya s hkaekyasai . shoetsaw laat yaar aainmhar hcaung shout mhu k aarayykyeetaal . aainmhar k i nywhaankyarrhkyet ko litenar par:  Claire Hughes hcaung shout mhu aarrloneko saint rae hkyane hcaung shout sayhkyaaraaung lote par . i sai aalwan aarayykyeeparsai .  a rar kyawin samyaha myarrmyarr r yuulitepar .  liin shisai shoetmahote sain aain gar jarat swayhtwatshipark tampons m sone par nae .  douche m htarrpar nae .  aapaannhpyay muuyaitsayywarrmyarr sayyliutsout shoetmahote aasone mapyu par nae .  a raat m sout r m nay par nae .  hpein dharat shawin kyain par . shin kyannmarrayyhcaungshoutmhu payy suu ko saatswal par: kowaansaung hcain sainhar  aalainn aain gar jarat swayhtwat shoetmahote a hcaat a pyawwat shisai .  sain k wambite narkyinmhu shoetmahote kyain kyaote shisai .  sain k aahpyarr shisai . shin hkyethkyinn aakuuaanye ko rayuupar:  sain k moesaeehtaanhcwar aain gar jarat swayhtwat shisai .  saint aain gar jarat k nay lar m ae sway hkell myarr shin hkyethkyinn aakuuaanye ko rayuupar:  sain k moesaeehtaanhcwar aain gar jarat swayhtwat shisai .  saint liin aain gar k nay lar m ae sway hkell shisai .  sain k pyinnhtaan a ni m pyan narkyinmhu shoetmahote wambite kyawat taat shisai .  sain k aahpyarr, hkyam tone hkyinn, pyinnhtaantae wambite narkyinmhu shisai .

## 2016-10-23 NOTE — MAU Provider Note (Signed)
Chief Complaint: Vaginal Bleeding   First Provider Initiated Contact with Patient 10/23/16 1418      SUBJECTIVE HPI: Claire Hughes is a 31 y.o. [id number] at [redacted]w[redacted]d by LMP who presents to maternity admissions reporting onset of light bleeding yesterday. She had positive pregnancy test at [facility] recently. She has hx of 2 miscarriages and is concerned about this. There is no pain or other associated symptoms.  She is sure of her lmp.  Patient's last menstrual period was 08/21/2016. She denies vaginal itching/burning, urinary symptoms, h/a, dizziness, n/v, or fever/chills.     HPI  Past Medical History:  Diagnosis Date  . Malaria 1994   as a child in [place]    History reviewed. No pertinent surgical history. Social History   Social History  . Marital status: Married    Spouse name: Glendora Score  . Number of children: 2  . Years of education: 6   Occupational History  . Housewife    Social History Main Topics  . Smoking status: Never Smoker  . Smokeless tobacco: Never Used  . Alcohol use No  . Drug use: No  . Sexual activity: Not Currently     Comment: previous used pill and depo   Other Topics Concern  . Not on file   Social History Narrative   Born in [place] in [place] for 6 years   [place]oved [place] in 06/2013.    Lives with husband, two sons, an uncle and Gwyndolyn Saxon Tintuep      No current facility-administered medications on file prior to encounter.    No current outpatient prescriptions on file prior to encounter.   No Known Allergies  ROS:  Review of Systems  Constitutional: Negative for chills, fatigue and fever.  Respiratory: Negative for shortness of breath.   Cardiovascular: Negative for chest pain.  Gastrointestinal: Negative for nausea and vomiting.  Genitourinary: Positive for vaginal bleeding. Negative for difficulty urinating, dysuria, flank pain, pelvic pain, vaginal discharge and vaginal pain.  Neurological: Negative for dizziness and  headaches.  Psychiatric/Behavioral: Negative.      I have reviewed patient's Past Medical Hx, Surgical Hx, Family Hx, Social Hx, medications and allergies.   Physical Exam   Patient Vitals for the past 24 hrs:  BP Temp Temp src Pulse Resp  10/23/16 1602 114/74 - - 82 16  10/23/16 1302 104/61 98.2 F (36.8 C) Oral 77 18   Constitutional: Well-developed, well-nourished female in no acute distress.  Cardiovascular: normal rate Respiratory: normal effort GI: Abd soft, non-tender. Pos BS x 4 MS: Extremities nontender, no edema, normal ROM Neurologic: Alert and oriented x 4.  GU: Neg CVAT.  PELVIC EXAM: Cervix pink, visually closed, without lesion,small amount dark red bleeding, vaginal walls and external genitalia normal Bimanual exam: Cervix 0/long/high, firm, anterior, neg CMT, uterus nontender, nonenlarged, adnexa without tenderness, enlargement, or mass   LAB RESULTS Results for orders placed or performed during the hospital encounter of 10/23/16 (from the past 24 hour(s))  Urinalysis, Routine w reflex microscopic     Status: Abnormal   Collection Time: 10/23/16  1:08 PM  Result Value Ref Range   Color, Urine STRAW (A) YELLOW   APPearance CLEAR CLEAR   Specific Gravity, Urine 1.005 1.005 - 1.030   pH 7.0 5.0 - 8.0   Glucose, UA NEGATIVE NEGATIVE mg/dL   Hgb urine dipstick MODERATE (A) NEGATIVE   Bilirubin Urine NEGATIVE NEGATIVE   Ketones, ur NEGATIVE NEGATIVE mg/dL   Protein, ur NEGATIVE NEGATIVE  mg/dL   Nitrite NEGATIVE NEGATIVE   Leukocytes, UA NEGATIVE NEGATIVE   RBC / HPF 0-5 0 - 5 RBC/hpf   WBC, UA 0-5 0 - 5 WBC/hpf   Bacteria, UA NONE SEEN NONE SEEN   Squamous Epithelial / LPF 0-5 (A) NONE SEEN  CBC     Status: Abnormal   Collection Time: 10/23/16  1:21 PM  Result Value Ref Range   WBC 6.4 4.0 - 10.5 K/uL   RBC 4.30 3.87 - 5.11 MIL/uL   Hemoglobin 13.2 12.0 - 15.0 g/dL   HCT 36.4 36.0 - 46.0 %   MCV 84.7 78.0 - 100.0 fL   MCH 30.7 26.0 - 34.0 pg   MCHC  36.3 (H) 30.0 - 36.0 g/dL   RDW 12.6 11.5 - 15.5 %   Platelets 235 150 - 400 K/uL  hCG, quantitative, pregnancy     Status: Abnormal   Collection Time: 10/23/16  1:21 PM  Result Value Ref Range   hCG, Beta Chain, Quant, S 23,659 (H) <5 mIU/mL  Pregnancy, urine POC     Status: Abnormal   Collection Time: 10/23/16  1:22 PM  Result Value Ref Range   Preg Test, Ur POSITIVE (A) NEGATIVE  Wet prep, genital     Status: Abnormal   Collection Time: 10/23/16  2:00 PM  Result Value Ref Range   Yeast Wet Prep HPF POC NONE SEEN NONE SEEN   Trich, Wet Prep NONE SEEN NONE SEEN   Clue Cells Wet Prep HPF POC NONE SEEN NONE SEEN   WBC, Wet Prep HPF POC MANY (A) NONE SEEN   Sperm NONE SEEN        IMAGING US Ob Comp Less 14 Wks  Result Date: 10/23/2016 CLINICAL DATA:  Abdominal pain in first trimester of pregnancy EXAM: OBSTETRIC <14 WK [place] AND TRANSVAGINAL OB US TECHNIQUE: Both transabdominal and transvaginal ultrasound examinations were performed for complete evaluation of the gestation as well as the maternal uterus, adnexal regions, and pelvic cul-de-sac. Transvaginal technique was performed to assess early pregnancy. COMPARISON:  None for this gestation FINDINGS: Intrauterine gestational sac: Present Yolk sac: Present ; potential second yolk sac without second fetal pole Embryo:  Present Cardiac Activity: Absent Heart Rate: N/A  bpm CRL:  3.7  mm   6 w   0 d                  [place] EDC: 06/15/2016 Subchorionic hemorrhage:  None identified Maternal uterus/adnexae: RIGHT ovary normal size and morphology, 2.7 x 1.7 x 2.7 cm. LEFT ovary normal size and morphology, 2.6 x 1.2 x 2.0 cm. No free pelvic fluid or adnexal masses. IMPRESSION: Gestational sac identified within the uterus containing a fetal pole and questionably 2 yolk sacs without visualization of a second fetal pole. No fetal cardiac activity is identified within the visualized fetal pole. Findings are suspicious but not yet definitive for failed  pregnancy. Recommend follow-up US in 10-14 days for definitive diagnosis. This recommendation follows SRU consensus guidelines: Diagnostic Criteria for Nonviable Pregnancy Early in the First Trimester. [place] [employer] Med 2013; 509:3267-12. Electronically Signed   By: Lavonia Dana M.D.   On: 10/23/2016 15:18   US Ob Transvaginal  Result Date: 10/23/2016 CLINICAL DATA:  Abdominal pain in first trimester of pregnancy EXAM: OBSTETRIC <14 WK [place] AND TRANSVAGINAL OB US TECHNIQUE: Both transabdominal and transvaginal ultrasound examinations were performed for complete evaluation of the gestation as well as the maternal uterus, adnexal regions, and pelvic cul-de-sac.  Transvaginal technique was performed to assess early pregnancy. COMPARISON:  None for this gestation FINDINGS: Intrauterine gestational sac: Present Yolk sac: Present ; potential second yolk sac without second fetal pole Embryo:  Present Cardiac Activity: Absent Heart Rate: N/A  bpm CRL:  3.7  mm   6 w   0 d                  [place] EDC: 06/15/2016 Subchorionic hemorrhage:  None identified Maternal uterus/adnexae: RIGHT ovary normal size and morphology, 2.7 x 1.7 x 2.7 cm. LEFT ovary normal size and morphology, 2.6 x 1.2 x 2.0 cm. No free pelvic fluid or adnexal masses. IMPRESSION: Gestational sac identified within the uterus containing a fetal pole and questionably 2 yolk sacs without visualization of a second fetal pole. No fetal cardiac activity is identified within the visualized fetal pole. Findings are suspicious but not yet definitive for failed pregnancy. Recommend follow-up US in 10-14 days for definitive diagnosis. This recommendation follows SRU consensus guidelines: Diagnostic Criteria for Nonviable Pregnancy Early in the First Trimester. [employer] Med 2013; 710:6269-48. Electronically Signed   By: Lavonia Dana M.D.   On: 10/23/2016 15:18    MAU Management/MDM: Ordered labs and [place] and reviewed results.  Gestational sac and 2 yolk sacs and fetal poles  visible so IUP confirmed today with possible twin pregnancy but pregnancy measures behind her LMP dates at [redacted]w[redacted]d. Dx is threatened miscarriage.  Precautions reviewed with pt/reasons to return to MAU.  Video interpreter used in [place] language for all communication.  Outpatient [place] ordered in 10 days for viability, pt to f/u in office for results afterwards.  Pt stable at time of discharge.  ASSESSMENT 1. Abdominal pain during pregnancy, first trimester   2. Vaginal bleeding in pregnancy, first trimester   3. Threatened miscarriage in early pregnancy     PLAN Discharge home with bleeding precautions  Allergies as of 10/23/2016   No Known Allergies     Medication List    STOP taking these medications   Salicylic Acid 40 % Misc     TAKE these medications   prenatal multivitamin Tabs tablet Take 1 tablet by mouth daily at 12 noon.      Follow-up [facility] for [facility] Follow up.   Specialty:  Obstetrics and Gynecology Why:       Suggest an edit             s ngya s nyya moesaeehtaanhcwar swayhtwat shoetmahote pyinnhtaan wambite narkyinmhushipark 8:00 a.m. mharjuulinel 2 raatnaetwin ultrasound Mau shoet pyanswarr sai . Contact information: [place] Certified Nurse-Midwife 10/23/2016  5:46 PM

## 2016-10-23 NOTE — MAU Note (Signed)
Pt C/O bleeding since yesterday, small amount.  Has lower back time @ times, not today.  Has pos UPT @ [facility].

## 2016-10-24 LAB — HIV ANTIBODY (ROUTINE TESTING W REFLEX): HIV Screen 4th Generation wRfx: NONREACTIVE

## 2016-10-26 LAB — GC/CHLAMYDIA PROBE AMP (~~LOC~~) NOT AT ARMC
Chlamydia: NEGATIVE
NEISSERIA GONORRHEA: NEGATIVE

## 2016-10-28 ENCOUNTER — Inpatient Hospital Stay (HOSPITAL_COMMUNITY): Payer: Medicaid Other

## 2016-10-28 ENCOUNTER — Inpatient Hospital Stay (HOSPITAL_COMMUNITY)
Admission: AD | Admit: 2016-10-28 | Discharge: 2016-10-28 | Disposition: A | Discharge status: Home / Self Care | Payer: Medicaid Other | Source: Ambulatory Visit | Attending: Family Medicine | Admitting: Family Medicine

## 2016-10-28 ENCOUNTER — Encounter (HOSPITAL_COMMUNITY): Payer: Self-pay | Admitting: *Deleted

## 2016-10-28 DIAGNOSIS — O3411 Maternal care for benign tumor of corpus uteri, first trimester: Secondary | ICD-10-CM | POA: Insufficient documentation

## 2016-10-28 DIAGNOSIS — O039 Complete or unspecified spontaneous abortion without complication: Secondary | ICD-10-CM | POA: Insufficient documentation

## 2016-10-28 DIAGNOSIS — O09891 Supervision of other high risk pregnancies, first trimester: Secondary | ICD-10-CM | POA: Diagnosis present

## 2016-10-28 DIAGNOSIS — Z3A01 Less than 8 weeks gestation of pregnancy: Secondary | ICD-10-CM | POA: Diagnosis not present

## 2016-10-28 DIAGNOSIS — R102 Pelvic and perineal pain: Secondary | ICD-10-CM | POA: Insufficient documentation

## 2016-10-28 DIAGNOSIS — O209 Hemorrhage in early pregnancy, unspecified: Secondary | ICD-10-CM

## 2016-10-28 DIAGNOSIS — D252 Subserosal leiomyoma of uterus: Secondary | ICD-10-CM | POA: Diagnosis not present

## 2016-10-28 HISTORY — DX: Other specified health status: Z78.9

## 2016-10-28 LAB — CBC WITH DIFFERENTIAL/PLATELET
BASOS PCT: 0 %
Basophils Absolute: 0 10*3/uL (ref 0.0–0.1)
EOS ABS: 0.1 10*3/uL (ref 0.0–0.7)
Eosinophils Relative: 1 %
HCT: 35.3 % — ABNORMAL LOW (ref 36.0–46.0)
Hemoglobin: 12.8 g/dL (ref 12.0–15.0)
Lymphocytes Relative: 47 %
Lymphs Abs: 2.8 10*3/uL (ref 0.7–4.0)
MCH: 30.6 pg (ref 26.0–34.0)
MCHC: 36.3 g/dL — AB (ref 30.0–36.0)
MCV: 84.4 fL (ref 78.0–100.0)
MONOS PCT: 5 %
Monocytes Absolute: 0.3 10*3/uL (ref 0.1–1.0)
Neutro Abs: 2.8 10*3/uL (ref 1.7–7.7)
Neutrophils Relative %: 47 %
Platelets: 213 10*3/uL (ref 150–400)
RBC: 4.18 MIL/uL (ref 3.87–5.11)
RDW: 12.4 % (ref 11.5–15.5)
WBC: 5.9 10*3/uL (ref 4.0–10.5)

## 2016-10-28 NOTE — MAU Note (Signed)
Bleeding has increased.  Denies pain at this time.  Had some pain yesterday.

## 2016-10-28 NOTE — Discharge Instructions (Signed)

## 2016-10-28 NOTE — MAU Provider Note (Signed)
History    First Provider Initiated Contact with Patient 10/28/16 1324      Chief Complaint:  Vaginal Bleeding   Claire Hughes is  31 y.o. [id number] Patient's last menstrual period was 08/21/2016.Marland Kitchen Patient is here for follow up of quantitative HCG and ongoing surveillance of pregnancy status.   She is [redacted]w[redacted]d weeks gestation  by early ultrasound 10/23/16. Possibly twin gestation , but only 1 FP seen measuring 6 weeks, no cardiac activity.   Since her last visit, the patient is with new complaint.     ROS Abdomin Pain: Intermittent cramping w/ passage of clots Vaginal bleeding: similar to period.   Passage of clots or tissue: Small to med clots, not tissue that she is aware of. Dizziness: mild  B POS  Burmese video interpreter used.   Physical Exam   Patient Vitals for the past 24 hrs:  BP Temp Temp src Pulse Resp SpO2 Weight  10/28/16 1305 101/67 97.9 F (36.6 C) Oral 76 16 97 % 112 lb (50.8 kg)   Constitutional: Well-nourished female in no apparent distress. No pallor Neuro: Alert and oriented 4 Cardiovascular: Normal rate Respiratory: Normal effort and rate Abdomen: Soft, nontender Gynecological Exam: normal external genitalia, vulva, vagina/ Small-mod amount of BRB. Cervix closed.   Labs: Results for orders placed or performed during the hospital encounter of 10/28/16 (from the past 24 hour(s))  CBC with Differential/Platelet   Collection Time: 10/28/16  1:22 PM  Result Value Ref Range   WBC 5.9 4.0 - 10.5 K/uL   RBC 4.18 3.87 - 5.11 MIL/uL   Hemoglobin 12.8 12.0 - 15.0 g/dL   HCT 35.3 (L) 36.0 - 46.0 %   MCV 84.4 78.0 - 100.0 fL   MCH 30.6 26.0 - 34.0 pg   MCHC 36.3 (H) 30.0 - 36.0 g/dL   RDW 12.4 11.5 - 15.5 %   Platelets 213 150 - 400 K/uL   Neutrophils Relative % 47 %   Neutro Abs 2.8 1.7 - 7.7 K/uL   Lymphocytes Relative 47 %   Lymphs Abs 2.8 0.7 - 4.0 K/uL   Monocytes Relative 5 %   Monocytes Absolute 0.3 0.1 - 1.0 K/uL   Eosinophils Relative 1 %    Eosinophils Absolute 0.1 0.0 - 0.7 K/uL   Basophils Relative 0 %   Basophils Absolute 0.0 0.0 - 0.1 K/uL    Ultrasound Studies:   US Ob Comp Less 14 Wks  Result Date: 10/23/2016 CLINICAL DATA:  Abdominal pain in first trimester of pregnancy EXAM: OBSTETRIC <14 WK [place] AND TRANSVAGINAL OB US TECHNIQUE: Both transabdominal and transvaginal ultrasound examinations were performed for complete evaluation of the gestation as well as the maternal uterus, adnexal regions, and pelvic cul-de-sac. Transvaginal technique was performed to assess early pregnancy. COMPARISON:  None for this gestation FINDINGS: Intrauterine gestational sac: Present Yolk sac: Present ; potential second yolk sac without second fetal pole Embryo:  Present Cardiac Activity: Absent Heart Rate: N/A  bpm CRL:  3.7  mm   6 w   0 d                  [place] EDC: 06/15/2016 Subchorionic hemorrhage:  None identified Maternal uterus/adnexae: RIGHT ovary normal size and morphology, 2.7 x 1.7 x 2.7 cm. LEFT ovary normal size and morphology, 2.6 x 1.2 x 2.0 cm. No free pelvic fluid or adnexal masses. IMPRESSION: Gestational sac identified within the uterus containing a fetal pole and questionably 2 yolk sacs without visualization of a second  fetal pole. No fetal cardiac activity is identified within the visualized fetal pole. Findings are suspicious but not yet definitive for failed pregnancy. Recommend follow-up US in 10-14 days for definitive diagnosis. This recommendation follows SRU consensus guidelines: Diagnostic Criteria for Nonviable Pregnancy Early in the First Trimester. [place] Med 2013; 161:0960-45. Electronically Signed   By: Lavonia Dana M.D.   On: 10/23/2016 15:18   US Ob Transvaginal  Result Date: 10/28/2016 CLINICAL DATA:  Vaginal bleeding in first trimester. First trimester pregnancy with inconclusive fetal viability. EXAM: TRANSVAGINAL OB ULTRASOUND TECHNIQUE: Transvaginal ultrasound was performed for complete evaluation of the gestation as  well as the maternal uterus, adnexal regions, and pelvic cul-de-sac. COMPARISON:  10/23/2016 FINDINGS: Intrauterine gestational sac: None; previously seen intrauterine gestational sac is no longer visualized, consistent with interval spontaneous abortion. Maternal uterus/adnexae: 1.5 cm subserosal fibroid seen in the right posterior uterine corpus. Normal appearance of both ovaries. No adnexal mass or free fluid identified. IMPRESSION: Previously seen intrauterine gestational sac no longer visualized, consistent with interval spontaneous abortion. 1.5 cm posterior subserosal fibroid. No adnexal mass or free fluid identified. Electronically Signed   By: Earle Gell M.D.   On: 10/28/2016 14:40   US Ob Transvaginal  Result Date: 10/23/2016 CLINICAL DATA:  Abdominal pain in first trimester of pregnancy EXAM: OBSTETRIC <14 WK [place] AND TRANSVAGINAL OB US TECHNIQUE: Both transabdominal and transvaginal ultrasound examinations were performed for complete evaluation of the gestation as well as the maternal uterus, adnexal regions, and pelvic cul-de-sac. Transvaginal technique was performed to assess early pregnancy. COMPARISON:  None for this gestation FINDINGS: Intrauterine gestational sac: Present Yolk sac: Present ; potential second yolk sac without second fetal pole Embryo:  Present Cardiac Activity: Absent Heart Rate: N/A  bpm CRL:  3.7  mm   6 w   0 d                  [place] EDC: 06/15/2016 Subchorionic hemorrhage:  None identified Maternal uterus/adnexae: RIGHT ovary normal size and morphology, 2.7 x 1.7 x 2.7 cm. LEFT ovary normal size and morphology, 2.6 x 1.2 x 2.0 cm. No free pelvic fluid or adnexal masses. IMPRESSION: Gestational sac identified within the uterus containing a fetal pole and questionably 2 yolk sacs without visualization of a second fetal pole. No fetal cardiac activity is identified within the visualized fetal pole. Findings are suspicious but not yet definitive for failed pregnancy. Recommend  follow-up US in 10-14 days for definitive diagnosis. This recommendation follows SRU consensus guidelines: Diagnostic Criteria for Nonviable Pregnancy Early in the First Trimester. [employer] Med 2013; 409:8119-14. Electronically Signed   By: Lavonia Dana M.D.   On: 10/23/2016 15:18    MAU course/MDM: [place], CBC ordered  Bleeding in early pregnancy with interval passage of POCs C/W completed AB. Hemodynamically stable.  Assessment: 1. Complete miscarriage   2. Vaginal bleeding in pregnancy, first trimester     Plan: Discharge home in stable condition. Bleeding precautions Support given. Declines Chaplain. Requesting work-up for recurrent AB. Will start work-u pat WOC and refer PRN.  Ibuprofen PRN for cramping.  Follow-up [facility] [place] Follow up.   Specialty:  Obstetrics and Gynecology Why:  will call to schedule follow-up appointment  Contact information: [place] [phone]       [place] Follow up.   Why:  in emergencies Contact information: [address]  [id number] [place] [phone]          Allergies as of 10/28/2016   No Known Allergies     Medication List    TAKE these medications   prenatal multivitamin Tabs tablet Take 1 tablet by mouth daily at 12 noon.       Tamala Julian, [place], [place] 10/28/2016, 1:52 PM  2/3

## 2016-11-02 ENCOUNTER — Ambulatory Visit (HOSPITAL_COMMUNITY)
Admit: 2016-11-02 | Discharge: 2016-11-02 | Disposition: A | Discharge status: Home / Self Care | Payer: Medicaid Other | Attending: Advanced Practice Midwife | Admitting: Advanced Practice Midwife

## 2016-11-02 ENCOUNTER — Encounter (HOSPITAL_COMMUNITY): Payer: Self-pay

## 2016-11-02 DIAGNOSIS — O200 Threatened abortion: Secondary | ICD-10-CM

## 2016-11-02 DIAGNOSIS — O209 Hemorrhage in early pregnancy, unspecified: Secondary | ICD-10-CM

## 2016-11-26 ENCOUNTER — Ambulatory Visit (INDEPENDENT_AMBULATORY_CARE_PROVIDER_SITE_OTHER): Payer: Medicaid Other | Admitting: Obstetrics & Gynecology

## 2016-11-26 ENCOUNTER — Encounter: Payer: Self-pay | Admitting: Obstetrics & Gynecology

## 2016-11-26 VITALS — BP 115/66 | HR 84 | Ht 60.0 in | Wt 119.0 lb

## 2016-11-26 DIAGNOSIS — N96 Recurrent pregnancy loss: Secondary | ICD-10-CM

## 2016-11-26 NOTE — Progress Notes (Signed)
   Subjective:    Patient ID: Claire Hughes, female    DOB: 07/12/1985, 31 y.o.   MRN: [id number]  HPI  31 yo Burmese-speaking G5P2A3 here for follow up after a miscarriage diagnosed in the MAU on 10-28-16. This is her second miscarriage and she would like the blood work associated with the work up for this.  She has no problems, would like another pregnancy.  Review of Systems     Objective:   Physical Exam [id number] Interpretor present for exam Abd- benign       Assessment & Plan:  3 miscarriages- start work up Rec that she continue her MVIs daily Rec condoms until we research the cause of her miscarriages

## 2016-12-02 LAB — TSH: TSH: 0.888 u[IU]/mL (ref 0.450–4.500)

## 2016-12-02 LAB — PROTEIN S ACTIVITY: PROTEIN S ACTIVITY: 70 % (ref 63–140)

## 2016-12-02 LAB — CARDIOLIPIN ANTIBODIES, IGM+IGG: Anticardiolipin IgM: <9 MPL U/mL (ref 0–12)

## 2016-12-02 LAB — HEMOGLOBIN A1C
ESTIMATED AVERAGE GLUCOSE: 94 mg/dL
Hgb A1c MFr Bld: 4.9 % (ref 4.8–5.6)

## 2016-12-02 LAB — FACTOR 5 LEIDEN

## 2016-12-14 ENCOUNTER — Ambulatory Visit: Payer: Medicaid Other | Admitting: Obstetrics & Gynecology

## 2017-05-04 NOTE — L&D Delivery Note (Addendum)
Delivery Note Dr. Ihor Dow present at delivery. At 9:55 AM a healthy female was delivered via Vaginal, Spontaneous; Presentation : OA.  APGAR:7 , 9; weight 2934.  Placenta status: Delivered with gentle traction complete , .  Cord:3 vessel cord  with the following complications: .  Cord pH: NA.    Anesthesia:  None Episiotomy: None Lacerations: Partial 4th degree (invoving anal Sphincter)Perineal, L-labial Hematoma noted, rectal exam shows that anal sphincter and capsule is intact.  Suture Repair: 3.0 vicryl rapide (repaired by Dr Ihor Dow). Est. Blood Loss (mL):  500  Ice pack to perineum for 24 hours.   Mom to postpartum.  Baby to Couplet care / Skin to Skin.  Claire Hughes 11/18/2017, 10:29 AM   Please schedule this patient for Postpartum visit in: 4 weeks with the following provider: Any provider For C/S patients schedule nurse incision check in weeks 2 weeks: no Low risk pregnancy complicated by: GDM Delivery mode:  SVD Anticipated Birth Control:  natural family planning PP Procedures needed: 2 hour GTT  Schedule Integrated [employer] visit: no

## 2017-05-31 ENCOUNTER — Other Ambulatory Visit (HOSPITAL_COMMUNITY): Payer: Self-pay | Admitting: Nurse Practitioner

## 2017-05-31 DIAGNOSIS — Z3682 Encounter for antenatal screening for nuchal translucency: Secondary | ICD-10-CM

## 2017-05-31 DIAGNOSIS — Z369 Encounter for antenatal screening, unspecified: Secondary | ICD-10-CM

## 2017-05-31 DIAGNOSIS — Z3A13 13 weeks gestation of pregnancy: Secondary | ICD-10-CM

## 2017-05-31 LAB — OB RESULTS CONSOLE VARICELLA ZOSTER ANTIBODY, IGG: Varicella: NON-IMMUNE/NOT IMMUNE

## 2017-05-31 LAB — OB RESULTS CONSOLE RPR
RPR: NONREACTIVE
RPR: NONREACTIVE

## 2017-05-31 LAB — OB RESULTS CONSOLE GC/CHLAMYDIA
CHLAMYDIA, DNA PROBE: NEGATIVE
GC PROBE AMP, GENITAL: NEGATIVE

## 2017-05-31 LAB — OB RESULTS CONSOLE RUBELLA ANTIBODY, IGM: RUBELLA: IMMUNE

## 2017-05-31 LAB — OB RESULTS CONSOLE HGB/HCT, BLOOD
HCT: 33
Hemoglobin: 11.5

## 2017-05-31 LAB — CYSTIC FIBROSIS DIAGNOSTIC STUDY: INTERPRETATION-CFDNA: NEGATIVE

## 2017-05-31 LAB — OB RESULTS CONSOLE HIV ANTIBODY (ROUTINE TESTING): HIV: NONREACTIVE

## 2017-05-31 LAB — OB RESULTS CONSOLE HEPATITIS B SURFACE ANTIGEN: Hepatitis B Surface Ag: NEGATIVE

## 2017-05-31 LAB — OB RESULTS CONSOLE ANTIBODY SCREEN: ANTIBODY SCREEN: NEGATIVE

## 2017-05-31 LAB — OB RESULTS CONSOLE PLATELET COUNT: Platelets: 258

## 2017-06-22 ENCOUNTER — Encounter (HOSPITAL_COMMUNITY): Payer: Self-pay | Admitting: *Deleted

## 2017-06-23 ENCOUNTER — Encounter (HOSPITAL_COMMUNITY): Payer: Self-pay

## 2017-06-23 ENCOUNTER — Other Ambulatory Visit (HOSPITAL_COMMUNITY): Payer: Self-pay | Admitting: *Deleted

## 2017-06-23 ENCOUNTER — Ambulatory Visit (HOSPITAL_COMMUNITY): Admission: RE | Admit: 2017-06-23 | Payer: Medicaid Other | Source: Ambulatory Visit

## 2017-06-23 ENCOUNTER — Other Ambulatory Visit (HOSPITAL_COMMUNITY): Payer: Self-pay | Admitting: Nurse Practitioner

## 2017-06-23 ENCOUNTER — Ambulatory Visit (HOSPITAL_COMMUNITY)
Admission: RE | Admit: 2017-06-23 | Discharge: 2017-06-23 | Disposition: A | Discharge status: Home / Self Care | Payer: Medicaid Other | Source: Ambulatory Visit | Attending: Nurse Practitioner | Admitting: Nurse Practitioner

## 2017-06-23 DIAGNOSIS — IMO0001 Reserved for inherently not codable concepts without codable children: Secondary | ICD-10-CM

## 2017-06-23 DIAGNOSIS — Z3682 Encounter for antenatal screening for nuchal translucency: Secondary | ICD-10-CM | POA: Insufficient documentation

## 2017-06-23 DIAGNOSIS — Z3687 Encounter for antenatal screening for uncertain dates: Secondary | ICD-10-CM

## 2017-06-23 DIAGNOSIS — Z3A17 17 weeks gestation of pregnancy: Secondary | ICD-10-CM | POA: Diagnosis not present

## 2017-06-23 DIAGNOSIS — Z369 Encounter for antenatal screening, unspecified: Secondary | ICD-10-CM

## 2017-06-23 DIAGNOSIS — Z3A13 13 weeks gestation of pregnancy: Secondary | ICD-10-CM

## 2017-07-14 ENCOUNTER — Other Ambulatory Visit (HOSPITAL_COMMUNITY): Payer: Self-pay | Admitting: *Deleted

## 2017-07-14 ENCOUNTER — Ambulatory Visit (HOSPITAL_COMMUNITY)
Admission: RE | Admit: 2017-07-14 | Discharge: 2017-07-14 | Disposition: A | Discharge status: Home / Self Care | Payer: Medicaid Other | Source: Ambulatory Visit | Attending: Nurse Practitioner | Admitting: Nurse Practitioner

## 2017-07-14 ENCOUNTER — Encounter (HOSPITAL_COMMUNITY): Payer: Self-pay

## 2017-07-14 DIAGNOSIS — O350XX0 Maternal care for (suspected) central nervous system malformation in fetus, not applicable or unspecified: Secondary | ICD-10-CM | POA: Insufficient documentation

## 2017-07-14 DIAGNOSIS — Z362 Encounter for other antenatal screening follow-up: Secondary | ICD-10-CM | POA: Diagnosis not present

## 2017-07-14 DIAGNOSIS — O359XX0 Maternal care for (suspected) fetal abnormality and damage, unspecified, not applicable or unspecified: Secondary | ICD-10-CM

## 2017-07-14 DIAGNOSIS — Z3A20 20 weeks gestation of pregnancy: Secondary | ICD-10-CM | POA: Diagnosis not present

## 2017-07-14 DIAGNOSIS — IMO0001 Reserved for inherently not codable concepts without codable children: Secondary | ICD-10-CM

## 2017-07-26 LAB — OB RESULTS CONSOLE HGB/HCT, BLOOD
HEMATOCRIT: 35
HEMOGLOBIN: 11.4

## 2017-09-22 ENCOUNTER — Encounter: Payer: Self-pay | Admitting: *Deleted

## 2017-09-23 ENCOUNTER — Encounter: Payer: Medicaid Other | Attending: Obstetrics and Gynecology | Admitting: *Deleted

## 2017-09-23 ENCOUNTER — Ambulatory Visit: Payer: Medicaid Other | Admitting: *Deleted

## 2017-09-23 DIAGNOSIS — R7302 Impaired glucose tolerance (oral): Secondary | ICD-10-CM

## 2017-09-23 NOTE — Progress Notes (Signed)
  Patient was seen on 09/23/2017 for Gestational Diabetes self-management. EDD 11/27/2017. Patient speaks Burmese, used International aid/development worker for this visit.  Patient states no history  of GDM. Diet history obtained. Patient eats good variety of all food groups and beverages include water and milk. She has omitted all fruit since she was diagnosed with GDM.  The following learning objectives were met by the patient :   States the definition of Gestational Diabetes  States why dietary management is important in controlling blood glucose  Describes the effects of carbohydrates on blood glucose levels  Demonstrates ability to create a balanced meal plan  Demonstrates carbohydrate counting   States when to check blood glucose levels  Demonstrates proper blood glucose monitoring techniques  States the effect of stress and exercise on blood glucose levels  States the importance of limiting caffeine and abstaining from alcohol and smoking  Plan:  Aim for 3 Carb Choices per meal (45 grams) +/- 1 either way  Aim for 1-2 Carbs per snack Begin reading food labels for Total Carbohydrate of foods Consider  increasing your activity level by walking or other activity daily as tolerated Begin checking BG before breakfast and 2 hours after first bite of breakfast, lunch and dinner as directed by MD  Bring Log Book/Sheet to every medical appointment   Patient not appropriate for Baby Scripts due to language barrier  Take medication if directed by MD  Blood glucose monitor Rx called into pharmacy: Accu Check Guide with Fast Clix drums Patient instructed to test pre breakfast and 2 hours each meal as directed by MD  Patient instructed to monitor glucose levels: FBS: 60 - 95 mg/dl 2 hour: <120 mg/dl  Patient received the following handouts: in [place] per her request  Nutrition Diabetes and Pregnancy  Carbohydrate Counting List  Patient will be seen for follow-up as needed.

## 2017-09-24 ENCOUNTER — Other Ambulatory Visit: Payer: Self-pay

## 2017-09-24 ENCOUNTER — Other Ambulatory Visit: Payer: Self-pay | Admitting: *Deleted

## 2017-09-24 DIAGNOSIS — O99810 Abnormal glucose complicating pregnancy: Secondary | ICD-10-CM

## 2017-09-24 MED ORDER — GLUCOSE BLOOD VI STRP: ORAL_STRIP | 12 refills | Status: DC

## 2017-09-24 MED ORDER — ACCU-CHEK FASTCLIX LANCETS MISC: 6 refills | Status: DC

## 2017-09-24 MED ORDER — BLOOD GLUCOSE MONITORING SUPPL W/DEVICE KIT: PACK | 0 refills | Status: DC

## 2017-09-24 NOTE — Progress Notes (Signed)
Received call from [facility] pharmacist that pt has come in to receive glucose testing supplies. They have not received Rx. Per chart review, pt met w/Diabetes Educator yesterday and was instructed for blood glucose monitoring 4 times daily. I returned the call th pharmacy and provided telephone order RX for all supplies needed.

## 2017-09-30 ENCOUNTER — Encounter: Payer: Self-pay | Admitting: Family Medicine

## 2017-09-30 ENCOUNTER — Ambulatory Visit (INDEPENDENT_AMBULATORY_CARE_PROVIDER_SITE_OTHER): Payer: Medicaid Other | Admitting: Family Medicine

## 2017-09-30 VITALS — BP 100/62 | HR 79 | Wt 133.0 lb

## 2017-09-30 DIAGNOSIS — O0993 Supervision of high risk pregnancy, unspecified, third trimester: Secondary | ICD-10-CM

## 2017-09-30 DIAGNOSIS — O24419 Gestational diabetes mellitus in pregnancy, unspecified control: Secondary | ICD-10-CM

## 2017-09-30 DIAGNOSIS — O0990 Supervision of high risk pregnancy, unspecified, unspecified trimester: Secondary | ICD-10-CM | POA: Insufficient documentation

## 2017-09-30 NOTE — Progress Notes (Signed)
Subjective:  Claire Hughes is a 32 y.o. [id number] at [redacted]w[redacted]d being seen today for Initial prenatal care.  She was referred here from [facility] for GDM. She is currently monitored for the following issues for this high-risk pregnancy and has Strain of right piriformis muscle; Hemangioma of skin; Malaria; and Supervision of high risk pregnancy, antepartum on their problem list.  GDM: Patient not taking anything.   Fasting: 100-105 2hr PP: 119-150.  Pt non-compliant with diet - eating a lot of rice.  Patient reports no complaints.  Contractions: Irritability. Vag. Bleeding: None.  Movement: Present. Denies leaking of fluid.   The following portions of the patient's history were reviewed and updated as appropriate: allergies, current medications, past family history, past medical history, past social history, past surgical history and problem list. Problem list updated.  Objective:   Vitals:   09/30/17 1523  BP: 100/62  Pulse: 79  Weight: 133 lb (60.3 kg)    Fetal Status: Fetal Heart Rate (bpm): 143   Movement: Present     General:  Alert, oriented and cooperative. Patient is in no acute distress.  Skin: Skin is warm and dry. No rash noted.   Cardiovascular: Normal heart rate noted  Respiratory: Normal respiratory effort, no problems with respiration noted  Abdomen: Soft, gravid, appropriate for gestational age. Pain/Pressure: Absent     Pelvic: Vag. Bleeding: None     Cervical exam deferred        Extremities: Normal range of motion.  Edema: None  Mental Status: Normal mood and affect. Normal behavior. Normal judgment and thought content.   Urinalysis:      Assessment and Plan:  Pregnancy: [id number] at [redacted]w[redacted]d  1. Supervision of high risk pregnancy, antepartum FHT and FH normal  2. Gestational diabetes mellitus (GDM) in third trimester, gestational diabetes method of control unspecified Will start with diet control - discussed diabetic diet. Return in 1 week. Discussed importance of  glycemic control - risks of c/s, shoulder dystocia, brachial plexus injuries, severe lacerations, anoxic brain injury.  Preterm labor symptoms and general obstetric precautions including but not limited to vaginal bleeding, contractions, leaking of fluid and fetal movement were reviewed in detail with the patient. Please refer to After Visit Summary for other counseling recommendations.  No follow-ups on file.   Truett Mainland, DO

## 2017-10-06 ENCOUNTER — Encounter (HOSPITAL_COMMUNITY): Payer: Self-pay

## 2017-10-06 ENCOUNTER — Ambulatory Visit (HOSPITAL_COMMUNITY)
Admission: RE | Admit: 2017-10-06 | Discharge: 2017-10-06 | Disposition: A | Discharge status: Home / Self Care | Payer: Medicaid Other | Source: Ambulatory Visit | Attending: Nurse Practitioner | Admitting: Nurse Practitioner

## 2017-10-06 ENCOUNTER — Other Ambulatory Visit (HOSPITAL_COMMUNITY): Payer: Self-pay | Admitting: Maternal and Fetal Medicine

## 2017-10-06 DIAGNOSIS — O359XX0 Maternal care for (suspected) fetal abnormality and damage, unspecified, not applicable or unspecified: Secondary | ICD-10-CM

## 2017-10-06 DIAGNOSIS — O24410 Gestational diabetes mellitus in pregnancy, diet controlled: Secondary | ICD-10-CM | POA: Diagnosis not present

## 2017-10-06 DIAGNOSIS — Z3A32 32 weeks gestation of pregnancy: Secondary | ICD-10-CM | POA: Diagnosis not present

## 2017-10-06 DIAGNOSIS — Z362 Encounter for other antenatal screening follow-up: Secondary | ICD-10-CM | POA: Insufficient documentation

## 2017-10-07 ENCOUNTER — Ambulatory Visit (INDEPENDENT_AMBULATORY_CARE_PROVIDER_SITE_OTHER): Payer: Medicaid Other | Admitting: Obstetrics and Gynecology

## 2017-10-07 VITALS — BP 110/60 | HR 89 | Wt 134.0 lb

## 2017-10-07 DIAGNOSIS — O24410 Gestational diabetes mellitus in pregnancy, diet controlled: Secondary | ICD-10-CM | POA: Insufficient documentation

## 2017-10-07 DIAGNOSIS — O0993 Supervision of high risk pregnancy, unspecified, third trimester: Secondary | ICD-10-CM

## 2017-10-07 DIAGNOSIS — Z789 Other specified health status: Secondary | ICD-10-CM

## 2017-10-07 DIAGNOSIS — O0990 Supervision of high risk pregnancy, unspecified, unspecified trimester: Secondary | ICD-10-CM

## 2017-10-07 LAB — POCT URINALYSIS DIP (DEVICE)
Bilirubin Urine: NEGATIVE
Glucose, UA: 500 mg/dL — AB
HGB URINE DIPSTICK: NEGATIVE
KETONES UR: NEGATIVE mg/dL
Leukocytes, UA: NEGATIVE
Nitrite: NEGATIVE
PROTEIN: NEGATIVE mg/dL
SPECIFIC GRAVITY, URINE: 1.015 (ref 1.005–1.030)
Urobilinogen, UA: 0.2 mg/dL (ref 0.0–1.0)
pH: 7 (ref 5.0–8.0)

## 2017-10-07 NOTE — Progress Notes (Signed)
Prenatal Visit Note Date: 10/07/2017 Clinic: [facility]  Subjective:  Claire Hughes is a 32 y.o. [id number] at [redacted]w[redacted]d being seen today for ongoing prenatal care.  She is currently monitored for the following issues for this high-risk pregnancy and has Strain of right piriformis muscle; Hemangioma of skin; Malaria; Supervision of high risk pregnancy, antepartum; and GDM, class A1 on their problem list.  Patient reports no complaints.   Contractions: Irritability. Vag. Bleeding: None.  Movement: Present. Denies leaking of fluid.   The following portions of the patient's history were reviewed and updated as appropriate: allergies, current medications, past family history, past medical history, past social history, past surgical history and problem list. Problem list updated.  Objective:   Vitals:   10/07/17 1334  BP: 110/60  Pulse: 89  Weight: 134 lb (60.8 kg)    Fetal Status: Fetal Heart Rate (bpm): 152   Movement: Present     General:  Alert, oriented and cooperative. Patient is in no acute distress.  Skin: Skin is warm and dry. No rash noted.   Cardiovascular: Normal heart rate noted  Respiratory: Normal respiratory effort, no problems with respiration noted  Abdomen: Soft, gravid, appropriate for gestational age. Pain/Pressure: Present     Pelvic:  Cervical exam deferred        Extremities: Normal range of motion.  Edema: None  Mental Status: Normal mood and affect. Normal behavior. Normal judgment and thought content.   Urinalysis:      Assessment and Plan:  Pregnancy: [id number] at [redacted]w[redacted]d  1. GDM, class A1 Normal BS log  2. Supervision of high risk pregnancy, antepartum Routine care. Natural family planning  Interpreter used  Preterm labor symptoms and general obstetric precautions including but not limited to vaginal bleeding, contractions, leaking of fluid and fetal movement were reviewed in detail with the patient. Please refer to After Visit  Summary for other counseling recommendations.  Return in about 2 weeks (around 10/21/2017) for 2wk rob.   Aletha Halim, MD

## 2017-10-07 NOTE — Progress Notes (Signed)
Video Interpreter # [id number]

## 2017-10-25 ENCOUNTER — Encounter: Payer: Self-pay | Admitting: Obstetrics and Gynecology

## 2017-10-25 ENCOUNTER — Ambulatory Visit (INDEPENDENT_AMBULATORY_CARE_PROVIDER_SITE_OTHER): Payer: Medicaid Other | Admitting: Obstetrics and Gynecology

## 2017-10-25 ENCOUNTER — Other Ambulatory Visit (HOSPITAL_COMMUNITY)
Admission: RE | Admit: 2017-10-25 | Discharge: 2017-10-25 | Disposition: A | Discharge status: Home / Self Care | Payer: Medicaid Other | Source: Ambulatory Visit | Attending: Obstetrics and Gynecology | Admitting: Obstetrics and Gynecology

## 2017-10-25 VITALS — BP 108/63 | HR 87 | Wt 135.3 lb

## 2017-10-25 DIAGNOSIS — O0990 Supervision of high risk pregnancy, unspecified, unspecified trimester: Secondary | ICD-10-CM

## 2017-10-25 DIAGNOSIS — O0993 Supervision of high risk pregnancy, unspecified, third trimester: Secondary | ICD-10-CM | POA: Diagnosis not present

## 2017-10-25 DIAGNOSIS — Z3A00 Weeks of gestation of pregnancy not specified: Secondary | ICD-10-CM | POA: Diagnosis not present

## 2017-10-25 DIAGNOSIS — Z789 Other specified health status: Secondary | ICD-10-CM

## 2017-10-25 DIAGNOSIS — O24410 Gestational diabetes mellitus in pregnancy, diet controlled: Secondary | ICD-10-CM

## 2017-10-25 LAB — OB RESULTS CONSOLE GBS: GBS: NEGATIVE

## 2017-10-25 MED ORDER — PRENATAL MULTIVITAMIN CH: 1.0000 | ORAL_TABLET | Freq: Every day | ORAL | 11 refills | Status: AC

## 2017-10-25 NOTE — Patient Instructions (Signed)

## 2017-10-25 NOTE — Progress Notes (Signed)
Subjective:  Claire Hughes is a 32 y.o. [phone] at [redacted]w[redacted]d being seen today for ongoing prenatal care.  She is currently monitored for the following issues for this high-risk pregnancy and has Hemangioma of skin; Malaria; Supervision of high risk pregnancy, antepartum; GDM, class A1; and Language barrier on their problem list.  Patient reports no complaints.  Contractions: Not present. Vag. Bleeding: None.  Movement: Present. Denies leaking of fluid.   The following portions of the patient's history were reviewed and updated as appropriate: allergies, current medications, past family history, past medical history, past social history, past surgical history and problem list. Problem list updated.  Objective:   Vitals:   10/25/17 1340  BP: 108/63  Pulse: 87  Weight: 135 lb 4.8 oz (61.4 kg)    Fetal Status: Fetal Heart Rate (bpm): 155   Movement: Present     General:  Alert, oriented and cooperative. Patient is in no acute distress.  Skin: Skin is warm and dry. No rash noted.   Cardiovascular: Normal heart rate noted  Respiratory: Normal respiratory effort, no problems with respiration noted  Abdomen: Soft, gravid, appropriate for gestational age. Pain/Pressure: Present     Pelvic:  Cervical exam performed        Extremities: Normal range of motion.  Edema: None  Mental Status: Normal mood and affect. Normal behavior. Normal judgment and thought content.   Urinalysis:      Assessment and Plan:  Pregnancy: [id number] at [redacted]w[redacted]d  1. Supervision of high risk pregnancy, antepartum Stable - Prenatal Vit-Fe Fumarate-FA (PRENATAL MULTIVITAMIN) TABS tablet; Take 1 tablet by mouth daily at 12 noon.  Dispense: 30 tablet; Refill: 11 - GC/Chlamydia probe amp ([place])not at [facility] - Culture, beta strep (group b only)  2. GDM, class A1 CBG's in goal range U/S 10/06/17 61 % growth Continue with diet  3. Language barrier Interrupter services used for today's visit  Preterm labor symptoms  and general obstetric precautions including but not limited to vaginal bleeding, contractions, leaking of fluid and fetal movement were reviewed in detail with the patient. Please refer to After Visit Summary for other counseling recommendations.  Return in about 1 week (around 11/01/2017) for OB visit.   Chancy Milroy, MD

## 2017-10-27 LAB — GC/CHLAMYDIA PROBE AMP (~~LOC~~) NOT AT ARMC
Chlamydia: NEGATIVE
Neisseria Gonorrhea: NEGATIVE

## 2017-10-29 LAB — CULTURE, BETA STREP (GROUP B ONLY): Strep Gp B Culture: NEGATIVE

## 2017-11-10 ENCOUNTER — Ambulatory Visit (INDEPENDENT_AMBULATORY_CARE_PROVIDER_SITE_OTHER): Payer: Medicaid Other | Admitting: Family Medicine

## 2017-11-10 VITALS — BP 114/68 | HR 80 | Wt 138.0 lb

## 2017-11-10 DIAGNOSIS — Z789 Other specified health status: Secondary | ICD-10-CM

## 2017-11-10 DIAGNOSIS — O24410 Gestational diabetes mellitus in pregnancy, diet controlled: Secondary | ICD-10-CM | POA: Diagnosis not present

## 2017-11-10 DIAGNOSIS — O0990 Supervision of high risk pregnancy, unspecified, unspecified trimester: Secondary | ICD-10-CM

## 2017-11-10 DIAGNOSIS — Z029 Encounter for administrative examinations, unspecified: Secondary | ICD-10-CM

## 2017-11-10 NOTE — Progress Notes (Signed)
   PRENATAL VISIT NOTE  Subjective:  Claire Hughes is a 32 y.o. [id number] at [redacted]w[redacted]d being seen today for ongoing prenatal care.  She is currently monitored for the following issues for this high-risk pregnancy and has Hemangioma of skin; Malaria; Supervision of high risk pregnancy, antepartum; GDM, class A1; and Language barrier on their problem list.  Patient reports no complaints.  Contractions: Irregular. Vag. Bleeding: None.  Movement: Present. Denies leaking of fluid.   The following portions of the patient's history were reviewed and updated as appropriate: allergies, current medications, past family history, past medical history, past social history, past surgical history and problem list. Problem list updated.  Objective:   Vitals:   11/10/17 1448  BP: 114/68  Pulse: 80  Weight: 138 lb (62.6 kg)    Fetal Status: Fetal Heart Rate (bpm): 142 Fundal Height: 39 cm Movement: Present  Presentation: Vertex  General:  Alert, oriented and cooperative. Patient is in no acute distress.  Skin: Skin is warm and dry. No rash noted.   Cardiovascular: Normal heart rate noted  Respiratory: Normal respiratory effort, no problems with respiration noted  Abdomen: Soft, gravid, appropriate for gestational age.  Pain/Pressure: Present     Pelvic: Cervical exam deferred        Extremities: Normal range of motion.  Edema: None  Mental Status: Normal mood and affect. Normal behavior. Normal judgment and thought content.   Assessment and Plan:  Pregnancy: [id number] at [redacted]w[redacted]d  1. Supervision of high risk pregnancy, antepartum Continue prenatal care.  2. Language barrier Video interpreter May used  3. GDM, class A1 FBS 79-100 (3 of 8 are out of range) 2 hour pp 71-180 (5 of 17 out of range) Add exercise U/s for growth at 38 wks. - [place] MFM OB FOLLOW UP; Future  Term labor symptoms and general obstetric precautions including but not limited to vaginal bleeding, contractions, leaking of fluid  and fetal movement were reviewed in detail with the patient. Please refer to After Visit Summary for other counseling recommendations.  Return in 1 week (on 11/17/2017).  Future Appointments  Date Time Provider [place]  11/18/2017  2:35 PM Aletha Halim, MD [facility] [place]  11/23/2017 10:30 AM [place] 5 WH-MFCUS MFC-US  11/25/2017  3:35 PM [place], Mervyn Skeeters, CNM WOC-WOCA WOC    Donnamae Jude, MD

## 2017-11-10 NOTE — Patient Instructions (Signed)

## 2017-11-17 ENCOUNTER — Encounter (HOSPITAL_COMMUNITY): Payer: Self-pay | Admitting: Emergency Medicine

## 2017-11-17 ENCOUNTER — Inpatient Hospital Stay (HOSPITAL_COMMUNITY)
Admission: AD | Admit: 2017-11-17 | Discharge: 2017-11-17 | Disposition: A | Discharge status: Home / Self Care | Payer: Medicaid Other | Source: Ambulatory Visit | Attending: Obstetrics and Gynecology | Admitting: Obstetrics and Gynecology

## 2017-11-17 DIAGNOSIS — O479 False labor, unspecified: Secondary | ICD-10-CM

## 2017-11-17 HISTORY — DX: Gestational diabetes mellitus in pregnancy, unspecified control: O24.419

## 2017-11-17 NOTE — Discharge Instructions (Signed)
Vaginal Delivery Vaginal delivery means that you will give birth by pushing your baby out of your birth canal (vagina). A team of health care providers will help you before, during, and after vaginal delivery. Birth experiences are unique for every woman and every pregnancy, and birth experiences vary depending on where you choose to give birth. What should I do to prepare for my baby's birth? Before your baby is born, it is important to talk with your health care provider about:  Your labor and delivery preferences. These may include: ? Medicines that you may be given. ? How you will manage your pain. This might include non-medical pain relief techniques or injectable pain relief such as epidural analgesia. ? How you and your baby will be monitored during labor and delivery. ? Who may be in the labor and delivery room with you. ? Your feelings about surgical delivery of your baby (cesarean delivery, or C-section) if this becomes necessary. ? Your feelings about receiving donated blood through an IV tube (blood transfusion) if this becomes necessary.  Whether you are able: ? To take pictures or videos of the birth. ? To eat during labor and delivery. ? To move around, walk, or change positions during labor and delivery.  What to expect after your baby is born, such as: ? Whether delayed umbilical cord clamping and cutting is offered. ? Who will care for your baby right after birth. ? Medicines or tests that may be recommended for your baby. ? Whether breastfeeding is supported in your hospital or birth center. ? How long you will be in the hospital or birth center.  How any medical conditions you have may affect your baby or your labor and delivery experience.  To prepare for your baby's birth, you should also:  Attend all of your health care visits before delivery (prenatal visits) as recommended by your health care provider. This is important.  Prepare your home for your baby's  arrival. Make sure that you have: ? Diapers. ? Baby clothing. ? Feeding equipment. ? Safe sleeping arrangements for you and your baby.  Install a car seat in your vehicle. Have your car seat checked by a certified car seat installer to make sure that it is installed safely.  Think about who will help you with your new baby at home for at least the first several weeks after delivery.  What can I expect when I arrive at the birth center or hospital? Once you are in labor and have been admitted into the hospital or birth center, your health care provider may:  Review your pregnancy history and any concerns you have.  Insert an IV tube into one of your veins. This is used to give you fluids and medicines.  Check your blood pressure, pulse, temperature, and heart rate (vital signs).  Check whether your bag of water (amniotic sac) has broken (ruptured).  Talk with you about your birth plan and discuss pain control options.  Monitoring Your health care provider may monitor your contractions (uterine monitoring) and your baby's heart rate (fetal monitoring). You may need to be monitored:  Often, but not continuously (intermittently).  All the time or for long periods at a time (continuously). Continuous monitoring may be needed if: ? You are taking certain medicines, such as medicine to relieve pain or make your contractions stronger. ? You have pregnancy or labor complications.  Monitoring may be done by:  Placing a special stethoscope or a handheld monitoring device on your abdomen to   check your baby's heartbeat, and feeling your abdomen for contractions. This method of monitoring does not continuously record your baby's heartbeat or your contractions.  Placing monitors on your abdomen (external monitors) to record your baby's heartbeat and the frequency and length of contractions. You may not have to wear external monitors all the time.  Placing monitors inside of your uterus  (internal monitors) to record your baby's heartbeat and the frequency, length, and strength of your contractions. ? Your health care provider may use internal monitors if he or she needs more information about the strength of your contractions or your baby's heart rate. ? Internal monitors are put in place by passing a thin, flexible wire through your vagina and into your uterus. Depending on the type of monitor, it may remain in your uterus or on your baby's head until birth. ? Your health care provider will discuss the benefits and risks of internal monitoring with you and will ask for your permission before inserting the monitors.  Telemetry. This is a type of continuous monitoring that can be done with external or internal monitors. Instead of having to stay in bed, you are able to move around during telemetry. Ask your health care provider if telemetry is an option for you.  Physical exam Your health care provider may perform a physical exam. This may include:  Checking whether your baby is positioned: ? With the head toward your vagina (head-down). This is most common. ? With the head toward the top of your uterus (head-up or breech). If your baby is in a breech position, your health care provider may try to turn your baby to a head-down position so you can deliver vaginally. If it does not seem that your baby can be born vaginally, your provider may recommend surgery to deliver your baby. In rare cases, you may be able to deliver vaginally if your baby is head-up (breech delivery). ? Lying sideways (transverse). Babies that are lying sideways cannot be delivered vaginally.  Checking your cervix to determine: ? Whether it is thinning out (effacing). ? Whether it is opening up (dilating). ? How low your baby has moved into your birth canal.  What are the three stages of labor and delivery?  Normal labor and delivery is divided into the following three stages: Stage 1  Stage 1 is the  longest stage of labor, and it can last for hours or days. Stage 1 includes: ? Early labor. This is when contractions may be irregular, or regular and mild. Generally, early labor contractions are more than 10 minutes apart. ? Active labor. This is when contractions get longer, more regular, more frequent, and more intense. ? The transition phase. This is when contractions happen very close together, are very intense, and may last longer than during any other part of labor.  Contractions generally feel mild, infrequent, and irregular at first. They get stronger, more frequent (about every 2-3 minutes), and more regular as you progress from early labor through active labor and transition.  Many women progress through stage 1 naturally, but you may need help to continue making progress. If this happens, your health care provider may talk with you about: ? Rupturing your amniotic sac if it has not ruptured yet. ? Giving you medicine to help make your contractions stronger and more frequent.  Stage 1 ends when your cervix is completely dilated to 4 inches (10 cm) and completely effaced. This happens at the end of the transition phase. Stage 2  Once   your cervix is completely effaced and dilated to 4 inches (10 cm), you may start to feel an urge to push. It is common for the body to naturally take a rest before feeling the urge to push, especially if you received an epidural or certain other pain medicines. This rest period may last for up to 1-2 hours, depending on your unique labor experience.  During stage 2, contractions are generally less painful, because pushing helps relieve contraction pain. Instead of contraction pain, you may feel stretching and burning pain, especially when the widest part of your baby's head passes through the vaginal opening (crowning).  Your health care provider will closely monitor your pushing progress and your baby's progress through the vagina during stage 2.  Your  health care provider may massage the area of skin between your vaginal opening and anus (perineum) or apply warm compresses to your perineum. This helps it stretch as the baby's head starts to crown, which can help prevent perineal tearing. ? In some cases, an incision may be made in your perineum (episiotomy) to allow the baby to pass through the vaginal opening. An episiotomy helps to make the opening of the vagina larger to allow more room for the baby to fit through.  It is very important to breathe and focus so your health care provider can control the delivery of your baby's head. Your health care provider may have you decrease the intensity of your pushing, to help prevent perineal tearing.  After delivery of your baby's head, the shoulders and the rest of the body generally deliver very quickly and without difficulty.  Once your baby is delivered, the umbilical cord may be cut right away, or this may be delayed for 1-2 minutes, depending on your baby's health. This may vary among health care providers, hospitals, and birth centers.  If you and your baby are healthy enough, your baby may be placed on your chest or abdomen to help maintain the baby's temperature and to help you bond with each other. Some mothers and babies start breastfeeding at this time. Your health care team will dry your baby and help keep your baby warm during this time.  Your baby may need immediate care if he or she: ? Showed signs of distress during labor. ? Has a medical condition. ? Was born too early (prematurely). ? Had a bowel movement before birth (meconium). ? Shows signs of difficulty transitioning from being inside the uterus to being outside of the uterus. If you are planning to breastfeed, your health care team will help you begin a feeding. Stage 3  The third stage of labor starts immediately after the birth of your baby and ends after you deliver the placenta. The placenta is an organ that develops  during pregnancy to provide oxygen and nutrients to your baby in the womb.  Delivering the placenta may require some pushing, and you may have mild contractions. Breastfeeding can stimulate contractions to help you deliver the placenta.  After the placenta is delivered, your uterus should tighten (contract) and become firm. This helps to stop bleeding in your uterus. To help your uterus contract and to control bleeding, your health care provider may: ? Give you medicine by injection, through an IV tube, by mouth, or through your rectum (rectally). ? Massage your abdomen or perform a vaginal exam to remove any blood clots that are left in your uterus. ? Empty your bladder by placing a thin, flexible tube (catheter) into your bladder. ? Encourage   you to breastfeed your baby. After labor is over, you and your baby will be monitored closely to ensure that you are both healthy until you are ready to go home. Your health care team will teach you how to care for yourself and your baby. This information is not intended to replace advice given to you by your health care provider. Make sure you discuss any questions you have with your health care provider. Document Released: 01/28/2008 Document Revised: 11/08/2015 Document Reviewed: 05/05/2015 Elsevier Interactive Patient Education  2018 Elsevier Inc.  

## 2017-11-17 NOTE — MAU Note (Signed)
Pt presents to MAU with c/o vag bleeding when she wipes. +FM Feels like a little fluid comes out sometimes. Pt complains of ctx that started yesterday morning 7/16 at 4am.

## 2017-11-18 ENCOUNTER — Inpatient Hospital Stay (HOSPITAL_COMMUNITY)
Admission: AD | Admit: 2017-11-18 | Discharge: 2017-11-19 | DRG: 768 | Disposition: A | Discharge status: Home / Self Care | Payer: Medicaid Other | Attending: Obstetrics & Gynecology | Admitting: Obstetrics & Gynecology

## 2017-11-18 ENCOUNTER — Inpatient Hospital Stay (HOSPITAL_COMMUNITY): Payer: Medicaid Other | Admitting: Anesthesiology

## 2017-11-18 ENCOUNTER — Encounter: Payer: Medicaid Other | Admitting: Obstetrics and Gynecology

## 2017-11-18 ENCOUNTER — Encounter (HOSPITAL_COMMUNITY): Payer: Self-pay | Admitting: *Deleted

## 2017-11-18 DIAGNOSIS — O24420 Gestational diabetes mellitus in childbirth, diet controlled: Secondary | ICD-10-CM | POA: Diagnosis present

## 2017-11-18 DIAGNOSIS — O24410 Gestational diabetes mellitus in pregnancy, diet controlled: Secondary | ICD-10-CM | POA: Diagnosis present

## 2017-11-18 DIAGNOSIS — Z3483 Encounter for supervision of other normal pregnancy, third trimester: Secondary | ICD-10-CM | POA: Diagnosis present

## 2017-11-18 DIAGNOSIS — Z3A38 38 weeks gestation of pregnancy: Secondary | ICD-10-CM

## 2017-11-18 DIAGNOSIS — Z789 Other specified health status: Secondary | ICD-10-CM | POA: Diagnosis present

## 2017-11-18 DIAGNOSIS — Z758 Other problems related to medical facilities and other health care: Secondary | ICD-10-CM | POA: Diagnosis present

## 2017-11-18 DIAGNOSIS — O479 False labor, unspecified: Secondary | ICD-10-CM | POA: Diagnosis present

## 2017-11-18 DIAGNOSIS — O0990 Supervision of high risk pregnancy, unspecified, unspecified trimester: Secondary | ICD-10-CM

## 2017-11-18 DIAGNOSIS — O24429 Gestational diabetes mellitus in childbirth, unspecified control: Secondary | ICD-10-CM

## 2017-11-18 LAB — CBC
HEMATOCRIT: 35.3 % — AB (ref 36.0–46.0)
Hemoglobin: 12.8 g/dL (ref 12.0–15.0)
MCH: 31.4 pg (ref 26.0–34.0)
MCHC: 36.3 g/dL — AB (ref 30.0–36.0)
MCV: 86.7 fL (ref 78.0–100.0)
Platelets: 169 10*3/uL (ref 150–400)
RBC: 4.07 MIL/uL (ref 3.87–5.11)
RDW: 13.4 % (ref 11.5–15.5)
WBC: 10.1 10*3/uL (ref 4.0–10.5)

## 2017-11-18 LAB — TYPE AND SCREEN
ABO/RH(D): B POS
Antibody Screen: NEGATIVE

## 2017-11-18 LAB — GLUCOSE, CAPILLARY: Glucose-Capillary: 107 mg/dL — ABNORMAL HIGH (ref 70–99)

## 2017-11-18 LAB — RPR: RPR: NONREACTIVE

## 2017-11-18 MED ORDER — LACTATED RINGERS IV SOLN: 500.0000 mL | Freq: Once | INTRAVENOUS | Status: DC

## 2017-11-18 MED ORDER — ZOLPIDEM TARTRATE 5 MG PO TABS: 5.0000 mg | ORAL_TABLET | Freq: Every evening | ORAL | Status: DC | PRN

## 2017-11-18 MED ORDER — LACTATED RINGERS IV SOLN: 500.0000 mL | INTRAVENOUS | Status: DC | PRN

## 2017-11-18 MED ORDER — PRENATAL MULTIVITAMIN CH
1.0000 | ORAL_TABLET | Freq: Every day | ORAL | Status: DC
  Administered 2017-11-18 – 2017-11-19 (×2): 1 via ORAL
  Filled 2017-11-18 (×2): qty 1

## 2017-11-18 MED ORDER — OXYTOCIN 40 UNITS IN LACTATED RINGERS INFUSION - SIMPLE MED
2.5000 [IU]/h | INTRAVENOUS | Status: DC
  Administered 2017-11-18: 2.5 [IU]/h via INTRAVENOUS

## 2017-11-18 MED ORDER — FENTANYL CITRATE (PF) 100 MCG/2ML IJ SOLN
100.0000 ug | INTRAMUSCULAR | Status: DC | PRN
  Administered 2017-11-18 (×5): 100 ug via INTRAVENOUS
  Filled 2017-11-18 (×5): qty 2

## 2017-11-18 MED ORDER — EPHEDRINE 5 MG/ML INJ
10.0000 mg | INTRAVENOUS | Status: DC | PRN
  Filled 2017-11-18: qty 2

## 2017-11-18 MED ORDER — OXYCODONE-ACETAMINOPHEN 5-325 MG PO TABS: 2.0000 | ORAL_TABLET | ORAL | Status: DC | PRN

## 2017-11-18 MED ORDER — LIDOCAINE HCL (PF) 1 % IJ SOLN
INTRAMUSCULAR | Status: DC | PRN
  Administered 2017-11-18: 5 mL via EPIDURAL
  Administered 2017-11-18: 2 mL via EPIDURAL

## 2017-11-18 MED ORDER — SIMETHICONE 80 MG PO CHEW: 80.0000 mg | CHEWABLE_TABLET | ORAL | Status: DC | PRN

## 2017-11-18 MED ORDER — WITCH HAZEL-GLYCERIN EX PADS: 1.0000 "application " | MEDICATED_PAD | CUTANEOUS | Status: DC | PRN

## 2017-11-18 MED ORDER — DIPHENHYDRAMINE HCL 50 MG/ML IJ SOLN: 12.5000 mg | INTRAMUSCULAR | Status: DC | PRN

## 2017-11-18 MED ORDER — DOCUSATE SODIUM 100 MG PO CAPS
100.0000 mg | ORAL_CAPSULE | Freq: Two times a day (BID) | ORAL | Status: DC
  Administered 2017-11-19 (×2): 100 mg via ORAL
  Filled 2017-11-18 (×2): qty 1

## 2017-11-18 MED ORDER — DIBUCAINE 1 % RE OINT: 1.0000 "application " | TOPICAL_OINTMENT | RECTAL | Status: DC | PRN

## 2017-11-18 MED ORDER — ACETAMINOPHEN 325 MG PO TABS: 650.0000 mg | ORAL_TABLET | ORAL | Status: DC | PRN

## 2017-11-18 MED ORDER — OXYCODONE-ACETAMINOPHEN 5-325 MG PO TABS
1.0000 | ORAL_TABLET | ORAL | Status: DC | PRN
  Administered 2017-11-18 – 2017-11-19 (×2): 1 via ORAL
  Filled 2017-11-18 (×2): qty 1

## 2017-11-18 MED ORDER — ERYTHROMYCIN 5 MG/GM OP OINT
TOPICAL_OINTMENT | OPHTHALMIC | Status: AC
  Filled 2017-11-18: qty 1

## 2017-11-18 MED ORDER — ONDANSETRON HCL 4 MG PO TABS: 4.0000 mg | ORAL_TABLET | ORAL | Status: DC | PRN

## 2017-11-18 MED ORDER — LACTATED RINGERS IV SOLN
INTRAVENOUS | Status: DC
  Administered 2017-11-18 (×2): via INTRAVENOUS

## 2017-11-18 MED ORDER — BENZOCAINE-MENTHOL 20-0.5 % EX AERO
1.0000 "application " | INHALATION_SPRAY | CUTANEOUS | Status: DC | PRN
  Administered 2017-11-18: 1 via TOPICAL
  Filled 2017-11-18: qty 56

## 2017-11-18 MED ORDER — LIDOCAINE HCL (PF) 1 % IJ SOLN
30.0000 mL | INTRAMUSCULAR | Status: AC | PRN
  Administered 2017-11-18 (×2): 30 mL via SUBCUTANEOUS
  Filled 2017-11-18: qty 30

## 2017-11-18 MED ORDER — FENTANYL 2.5 MCG/ML BUPIVACAINE 1/10 % EPIDURAL INFUSION (WH - ANES): 14.0000 mL/h | INTRAMUSCULAR | Status: DC | PRN

## 2017-11-18 MED ORDER — PHENYLEPHRINE 40 MCG/ML (10ML) SYRINGE FOR IV PUSH (FOR BLOOD PRESSURE SUPPORT)
PREFILLED_SYRINGE | INTRAVENOUS | Status: AC
  Filled 2017-11-18: qty 10

## 2017-11-18 MED ORDER — PHENYLEPHRINE 40 MCG/ML (10ML) SYRINGE FOR IV PUSH (FOR BLOOD PRESSURE SUPPORT)
80.0000 ug | PREFILLED_SYRINGE | INTRAVENOUS | Status: DC | PRN
  Filled 2017-11-18: qty 5

## 2017-11-18 MED ORDER — OXYTOCIN 40 UNITS IN LACTATED RINGERS INFUSION - SIMPLE MED
INTRAVENOUS | Status: AC
  Filled 2017-11-18: qty 1000

## 2017-11-18 MED ORDER — COCONUT OIL OIL: 1.0000 "application " | TOPICAL_OIL | Status: DC | PRN

## 2017-11-18 MED ORDER — IBUPROFEN 600 MG PO TABS
600.0000 mg | ORAL_TABLET | Freq: Four times a day (QID) | ORAL | Status: DC
  Administered 2017-11-18 – 2017-11-19 (×5): 600 mg via ORAL
  Filled 2017-11-18 (×5): qty 1

## 2017-11-18 MED ORDER — FENTANYL 2.5 MCG/ML BUPIVACAINE 1/10 % EPIDURAL INFUSION (WH - ANES)
INTRAMUSCULAR | Status: AC
  Filled 2017-11-18: qty 100

## 2017-11-18 MED ORDER — ONDANSETRON HCL 4 MG/2ML IJ SOLN: 4.0000 mg | INTRAMUSCULAR | Status: DC | PRN

## 2017-11-18 MED ORDER — TETANUS-DIPHTH-ACELL PERTUSSIS 5-2.5-18.5 LF-MCG/0.5 IM SUSP: 0.5000 mL | Freq: Once | INTRAMUSCULAR | Status: DC

## 2017-11-18 MED ORDER — DIPHENHYDRAMINE HCL 25 MG PO CAPS: 25.0000 mg | ORAL_CAPSULE | Freq: Four times a day (QID) | ORAL | Status: DC | PRN

## 2017-11-18 MED ORDER — OXYTOCIN BOLUS FROM INFUSION
500.0000 mL | Freq: Once | INTRAVENOUS | Status: AC
  Administered 2017-11-18: 500 mL via INTRAVENOUS

## 2017-11-18 MED ORDER — ONDANSETRON HCL 4 MG/2ML IJ SOLN: 4.0000 mg | Freq: Four times a day (QID) | INTRAMUSCULAR | Status: DC | PRN

## 2017-11-18 MED ORDER — SOD CITRATE-CITRIC ACID 500-334 MG/5ML PO SOLN: 30.0000 mL | ORAL | Status: DC | PRN

## 2017-11-18 MED ORDER — LIDOCAINE HCL (PF) 1 % IJ SOLN
INTRAMUSCULAR | Status: AC
  Filled 2017-11-18: qty 30

## 2017-11-18 NOTE — Anesthesia Pain Management Evaluation Note (Signed)
  CRNA Pain Management Visit Note  Patient: Claire Hughes, 32 y.o., female  "Hello I am a member of the anesthesia team at [facility]. We have an anesthesia team available at all times to provide care throughout the hospital, including epidural management and anesthesia for C-section. I don't know your plan for the delivery whether it a natural birth, water birth, IV sedation, nitrous supplementation, doula or epidural, but we want to meet your pain goals."   1.Was your pain managed to your expectations on prior hospitalizations?   Yes   2.What is your expectation for pain management during this hospitalization?     IV pain meds  3.How can we help you reach that goal? unsure  Record the patient's initial score and the patient's pain goal.   Pain: 2  Pain Goal: 8 [facility] wants you to be able to say your pain was always managed very well.  Reported from RN Pt speaks A  [place] dialect that they could not find a Optometrist for.  Casimer Lanius 11/18/2017

## 2017-11-18 NOTE — Anesthesia Postprocedure Evaluation (Signed)
Anesthesia Post Note  Patient: Claire Hughes  Procedure(s) Performed: AN AD [place]     Patient location during evaluation: Mother Baby Anesthesia Type: Epidural Level of consciousness: awake and alert and oriented Pain management: satisfactory to patient Vital Signs Assessment: post-procedure vital signs reviewed and stable Respiratory status: spontaneous breathing and nonlabored ventilation Cardiovascular status: stable Postop Assessment: no headache, no backache, no signs of nausea or vomiting, adequate PO intake and patient able to bend at knees (patient up walking) Anesthetic complications: no    Last Vitals:  Vitals:   11/18/17 1300 11/18/17 1740  BP: 90/61 (!) 100/55  Pulse: 89 90  Resp: 18 18  Temp: 37.2 C 37.1 C  SpO2: 99% 98%    Last Pain:  Vitals:   11/18/17 1740  TempSrc: Oral  PainSc:    Pain Goal:                 [name]

## 2017-11-18 NOTE — Progress Notes (Signed)
Video Interpreter- Pyi [id number] used to assess pt. Pain; for epidural placement; for delivery.

## 2017-11-18 NOTE — Progress Notes (Signed)
Language line attempted for admission.  They were not able to reach any [place]ese interpreters.  Language associate requested that we call back later this am to check again to see if any available.

## 2017-11-18 NOTE — Progress Notes (Signed)
Patient ID: Claire Hughes, female   DOB: Nov 08, 1985, 32 y.o.   MRN: [id number] AROM clear fluid Cervix was 9cm with bulging bag  Decreased after rupture  . Vitals:   11/18/17 0530 11/18/17 0600 11/18/17 0630 11/18/17 0700  BP: 100/65 125/87 109/71 116/81  Pulse: 86 92 89 90  Resp:  18    Temp:  98.6 F (37 C)    TempSrc:  Oral    Weight:      Height:       FHR stable UCs every 2 min Dilation: 7.5 Effacement (%): 100 Cervical Position: Middle Station: -1 Presentation: Vertex Exam by:: Carmelia Roller, CNM

## 2017-11-18 NOTE — H&P (Addendum)
OBSTETRIC ADMISSION HISTORY AND PHYSICAL  Level 5 caveat due to language barrier and interpreter unavailable.   Claire Hughes is a 32 y.o. female [phone] with IUP at 49w5dby 17 wk u/s presenting for spontaneous onset of labor.  Patient came to MAU and found to be 6cm dilated with a bulging bag. Patient speaks Burmese only and we were unable to take a history on her. Per her notes, she has gestational diabetes.  She plans on breast/bottle feeding. She is doing nothing for birth control. [facility]    _0 , CWD, normal anatomy, cephalic presentation,  24650 61% EFW   Prenatal History/Complications:  Past Medical History: Past Medical History:  Diagnosis Date  . [place]   . Gestational diabetes   . [place] 1994   [place]  . Medical history non-contributory     Past Surgical History: Past Surgical History:  Procedure Laterality Date  . NO PAST SURGERIES      Obstetrical History: OB History    Gravida  6   Para  2   Term  2   Preterm  0   AB  3   Living  2     SAB  3   TAB  0   Ectopic  0   Multiple  0   Live Births  2           Social History: Social History   Socioeconomic History  . Marital status: Married    Spouse name: VGlendora Score . Number of children: 2  . Years of education: 6  . Highest education level: Not on file  Occupational History  . Occupation: Housewife  Social Needs  . Financial resource strain: Not on file  . Food insecurity:    Worry: Not on file    Inability: Not on file  . Transportation needs:    Medical: Not on file    Non-medical: Not on file  Tobacco Use  . Smoking status: Never Smoker  . Smokeless tobacco: Never Used  Substance and Sexual Activity  . Alcohol use: No  . Drug use: No  . Sexual activity: Not Currently    Birth control/protection: None    Comment: previous used pill and depo  Lifestyle  . Physical  activity:    Days per week: Not on file    Minutes per session: Not on file  . Stress: Not on file  Relationships  . Social connections:    Talks on phone: Not on file    Gets together: Not on file    Attends religious service: Not on file    Active member of club or organization: Not on file    Attends meetings of clubs or organizations: Not on file    Relationship status: Not on file  Other Topics Concern  . Not on file  Social History Narrative   [place]   [place]    Lives with husband, two sons, an uncle and WGwyndolyn SaxonTintuep    Family History: Family History  Problem Relation Age of Onset  . Cancer Neg Hx     Allergies: No Known Allergies  Medications Prior to Admission  Medication Sig Dispense Refill Last Dose  . ACCU-CHEK FASTCLIX LANCETS MISC Check blood sugar 4 times daily as instructed 102 each 6 Taking  . Blood Glucose Monitoring  Suppl w/Device KIT Use meter to test blood sugar 4 times daily 1 each 0 Taking  . glucose blood test strip Test blood sugar 4 times daily as instructed 50 each 12 Taking  . Prenatal Vit-Fe Fumarate-FA (PRENATAL MULTIVITAMIN) TABS tablet Take 1 tablet by mouth daily at 12 noon. 30 tablet 11 Taking     Review of Systems   All systems reviewed and negative except as stated in HPI  Blood pressure (!) 107/58, pulse 97, temperature 98.8 F (37.1 C), temperature source Oral, resp. rate 20, height 4' 7.5" (1.41 m), weight 62.6 kg (138 lb), last menstrual period 03/23/2017. General appearance: alert and moderate distress Lungs: clear to auscultation bilaterally Heart: regular rate and rhythm Abdomen: gravid Extremities: no sign of DVT Presentation: cephalic Fetal monitoringBaseline: 145 bpm, Variability: Good {> 6 bpm), Accelerations: Reactive and Decelerations: Absent Uterine activityFrequency: Every 3-4 minutes, Duration: 60 seconds and Intensity: strong Dilation:  8 Effacement (%): 90 Station: -2 Exam by:: A. Durene Romans, [facility]    Prenatal labs: ABO, Rh: --/--/B POS (07/18 0342) Antibody: NEG (07/18 0342) Rubella: Immune (01/28 0000) RPR: Nonreactive, Nonreactive (01/28 0000)  HBsAg: Negative (01/28 0000)  HIV: Non-reactive (01/28 0000)  GBS: Negative (06/24 0000)  1 hr Glucola abnormal Genetic screening  n/a Anatomy US normal  Prenatal Transfer Tool  Maternal Diabetes: Yes:  Diabetes Type:  Diet controlled Genetic Screening: Declined Maternal Ultrasounds/Referrals: Normal Fetal Ultrasounds or other Referrals:  None Maternal Substance Abuse:  No Significant Maternal Medications:  None Significant Maternal Lab Results: None  Results for orders placed or performed during the hospital encounter of 11/18/17 (from the past 24 hour(s))  CBC   Collection Time: 11/18/17  3:42 AM  Result Value Ref Range   WBC 10.1 4.0 - 10.5 K/uL   RBC 4.07 3.87 - 5.11 MIL/uL   Hemoglobin 12.8 12.0 - 15.0 g/dL   HCT 35.3 (L) 36.0 - 46.0 %   MCV 86.7 78.0 - 100.0 fL   MCH 31.4 26.0 - 34.0 pg   MCHC 36.3 (H) 30.0 - 36.0 g/dL   RDW 13.4 11.5 - 15.5 %   Platelets 169 150 - 400 K/uL  Type and screen [place]   Collection Time: 11/18/17  3:42 AM  Result Value Ref Range   ABO/RH(D) B POS    Antibody Screen NEG    Sample Expiration      11/21/2017 Performed at [facility], [address], [place], [place] [address]   Glucose, capillary   Collection Time: 11/18/17  3:54 AM  Result Value Ref Range   Glucose-Capillary 107 (H) 70 - 99 mg/dL    Patient Active Problem List   Diagnosis Date Noted  . Uterine contractions during pregnancy 11/18/2017  . GDM, class A1 10/07/2017  . Language barrier 10/07/2017  . Supervision of high risk pregnancy, antepartum 09/30/2017  . 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 Malaria 05/04/1992    Assessment/Plan:  Claire Hughes is a 32 y.o. [id number] at 33w5dhere for spontaneous onset of labor after  being found to be 6cm dilated in the MAU.  Pregnancy is complicated by [id number] language barrier.    #Labor: expectant management.  Patient currently at 8cm dilated on last check.  #Pain: IV pain meds.   #FWB: Cat I #ID:  GBS neg #MOF: breast/bottle #MOC:doesn't want any.   DBenay Pike MD  11/18/2017, 4:49 AM  I confirm that I have verified the information documented in the resident's note and that I have  also personally reperformed the physical exam and all medical decision making activities. The patient was seen and examined by me also Agree with note NST reactive and reassuring UCs as listed Cervical exams as listed in note Will proceed expectantly for now Will augment if labor stalls  Seabron Spates, CNM

## 2017-11-18 NOTE — Anesthesia Preprocedure Evaluation (Signed)
Anesthesia Evaluation  Patient identified by MRN, date of birth, ID band Patient awake    Reviewed: Allergy & Precautions, Patient's Chart, lab work & pertinent test results  Airway Mallampati: II       Dental   Pulmonary neg pulmonary ROS,    Pulmonary exam normal        Cardiovascular negative cardio ROS Normal cardiovascular exam     Neuro/Psych negative neurological ROS     GI/Hepatic negative GI ROS, Neg liver ROS,   Endo/Other  diabetes, Gestational  Renal/GU negative Renal ROS     Musculoskeletal   Abdominal   Peds  Hematology negative hematology ROS (+)   Anesthesia Other Findings   Reproductive/Obstetrics (+) Pregnancy                             Anesthesia Physical Anesthesia Plan  ASA: II  Anesthesia Plan: Epidural   Post-op Pain Management:    Induction:   PONV Risk Score and Plan: Treatment may vary due to age or medical condition  Airway Management Planned: Natural Airway  Additional Equipment:   Intra-op Plan:   Post-operative Plan:   Informed Consent: I have reviewed the patients History and Physical, chart, labs and discussed the procedure including the risks, benefits and alternatives for the proposed anesthesia with the patient or authorized representative who has indicated his/her understanding and acceptance.     Plan Discussed with:   Anesthesia Plan Comments:         Anesthesia Quick Evaluation

## 2017-11-18 NOTE — Lactation Note (Signed)
This note was copied from a baby's chart. Lactation Consultation Note  Patient Name: Claire Hughes [id number] Date: 11/18/2017   Initial LC Visit:  Mother and family sleeping; will visit again later tonight.  Mother has been supplementing with formula.    Sondra Blixt R Rector Devonshire 11/18/2017, 11:50 PM

## 2017-11-18 NOTE — Anesthesia Procedure Notes (Signed)
Epidural Patient location during procedure: OB Start time: 11/18/2017 9:12 AM End time: 11/18/2017 9:22 AM  Staffing Anesthesiologist: Suzette Battiest, MD Performed: anesthesiologist   Preanesthetic Checklist Completed: patient identified, site marked, surgical consent, pre-op evaluation, timeout performed, IV checked, risks and benefits discussed and monitors and equipment checked  Epidural Patient position: sitting Prep: site prepped and draped and DuraPrep Patient monitoring: continuous pulse ox and blood pressure Approach: midline Location: L4-L5 Injection technique: LOR air  Needle:  Needle type: Tuohy  Needle gauge: 17 G Needle length: 9 cm and 9 Needle insertion depth: 6 cm Catheter type: closed end flexible Catheter size: 19 Gauge Catheter at skin depth: 11 cm Test dose: negative  Assessment Events: blood not aspirated, injection not painful, no injection resistance, negative IV test and no paresthesia

## 2017-11-19 LAB — CBC
HEMATOCRIT: 29 % — AB (ref 36.0–46.0)
HEMOGLOBIN: 10.5 g/dL — AB (ref 12.0–15.0)
MCH: 32.1 pg (ref 26.0–34.0)
MCHC: 36.2 g/dL — AB (ref 30.0–36.0)
MCV: 88.7 fL (ref 78.0–100.0)
Platelets: 121 10*3/uL — ABNORMAL LOW (ref 150–400)
RBC: 3.27 MIL/uL — ABNORMAL LOW (ref 3.87–5.11)
RDW: 13.6 % (ref 11.5–15.5)
WBC: 11 10*3/uL — ABNORMAL HIGH (ref 4.0–10.5)

## 2017-11-19 MED ORDER — OXYCODONE-ACETAMINOPHEN 5-325 MG PO TABS: 1.0000 | ORAL_TABLET | Freq: Four times a day (QID) | ORAL | Status: DC | PRN

## 2017-11-19 MED ORDER — IBUPROFEN 600 MG PO TABS: 600.0000 mg | ORAL_TABLET | Freq: Four times a day (QID) | ORAL | 0 refills | Status: AC

## 2017-11-19 MED ORDER — IBUPROFEN 800 MG PO TABS: 400.0000 mg | ORAL_TABLET | Freq: Four times a day (QID) | ORAL | Status: DC | PRN

## 2017-11-19 MED ORDER — OXYCODONE-ACETAMINOPHEN 5-325 MG PO TABS: 1.0000 | ORAL_TABLET | Freq: Four times a day (QID) | ORAL | 0 refills | Status: AC | PRN

## 2017-11-19 NOTE — Progress Notes (Signed)
Due to high hospital census and acuity and limited staffing, CSW is unable to meet with MOB to complete assessment for [place] of 10.  CSW spoke with bedside nurse via telephone and PMAD education will be provided at discharge.  Bedside did not have any concerns or noted any additional psychosocial stressors.   Please consult CSW if concerns arise or by MOB's request.  There are no barriers to discharge.  Laurey Arrow, MSW, LCSW Clinical Social Work [phone]

## 2017-11-19 NOTE — Progress Notes (Addendum)
Discharge instructions reviewed with patient and significant other using interpreter number [id number], answered questions and concerns.

## 2017-11-19 NOTE — Discharge Summary (Signed)
OB Discharge Summary     Patient Name: Claire Hughes DOB: 12-19-1985 MRN: [id number]  Date of admission: 11/18/2017 Delivering MD: Lavonia Drafts   Date of discharge: 11/19/2017  Admitting diagnosis: 39 WEEKS CTX Intrauterine pregnancy: [redacted]w[redacted]d    Secondary diagnosis:  Active Problems:   Perineal laceration during delivery   GDM, class A1   Language barrier   Uterine contractions during pregnancy  Additional problems: 4th degree perineal tear repair      Discharge diagnosis: Term Pregnancy Delivered, GDM A1 and 4th degree perineal repair.                                                                                                Post partum procedures:Partial 4th degree perineal tear repair   Augmentation: none  Complications: None  Hospital course:  Onset of Labor With Vaginal Delivery     32y.o. yo [id number] admitted in Active Labor on 11/18/2017. Patient had an uncomplicated labor course as follows:  Membrane Rupture Time/Date: 7:09 AM ,11/18/2017   Intrapartum Procedures: Episiotomy: None [1]                                         Lacerations:  4th degree [5];Perineal [11]  Patient had a delivery of a Viable infant. 11/18/2017  Information for the patient's newborn:  ThMalaysia, Crance0[[id number]]Delivery Method: Vaginal, Spontaneous(Filed from Delivery Summary)    Pateint had an uncomplicated postpartum course.  She is ambulating, tolerating a regular diet, passing flatus, and urinating well. Patient is discharged home in stable condition on 11/19/17.   Physical exam  Vitals:   11/18/17 1740 11/18/17 2200 11/19/17 0058 11/19/17 0515  BP: (!) 100/55 98/61 (!) 95/53 (!) 92/54  Pulse: 90 88 83 79  Resp: _0 Temp: 98.7 F (37.1 C) 98.6 F (37 C) 98.5 F (36.9 C) 98.4 F (36.9 C)  TempSrc: Oral Oral Oral Oral  SpO2: 98% 99% 99% 99%  Weight:      Height:       General: alert, cooperative and no distress Lochia:  appropriate Uterine Fundus: firm Perineal Repair site: Healing well with no significant drainage DVT Evaluation: No evidence of DVT seen on physical exam. No cords or calf tenderness. No significant calf/ankle edema. Labs: Lab Results  Component Value Date   WBC 11.0 (H) 11/19/2017   HGB 10.5 (L) 11/19/2017   HCT 29.0 (L) 11/19/2017   MCV 88.7 11/19/2017   PLT 121 (L) 11/19/2017   No flowsheet data found.  Discharge instruction: per After Visit Summary and "Baby and Me Booklet".  After visit meds:  Allergies as of 11/19/2017   No Known Allergies     Medication List    STOP taking these medications   ACCU-CHEK FASTCLIX LANCETS Misc   Blood Glucose Monitoring Suppl w/Device Kit   glucose blood test strip     TAKE these medications   prenatal multivitamin Tabs tablet Take 1 tablet by  mouth daily at 12 noon.       Diet: routine diet  Activity: Advance as tolerated. Pelvic rest for 6 weeks.   Outpatient follow up:4 weeks Follow up Appt: Future Appointments  Date Time Provider [place]  11/23/2017 10:30 AM WH-MFC [place] 5 WH-MFCUS MFC-US   Follow up Visit:No follow-ups on file.  Postpartum contraception: None  Newborn Data: Live born female  Birth Weight: 6 lb 7.5 oz (2934 g) APGAR: 7, 9  Newborn Delivery   Birth date/time:  11/18/2017 09:55:00 Delivery type:  Vaginal, Spontaneous     Baby Feeding: Breast Disposition:home with mother   11/19/2017 Justus Memory, MD

## 2017-11-19 NOTE — Progress Notes (Signed)
Post Partum Day 1 Subjective: no complaints, up ad lib, tolerating PO and + flatus, Patient has not been able to evaluate her vaginal or vulvar repair, says that her bleeding is not much from the pad changes, no headache, nausea, vomiting, edema or visual changes, she says she does not feel dizziness or SOB.  Objective: Blood pressure (!) 92/54, pulse 79, temperature 98.4 F (36.9 C), temperature source Oral, resp. rate 18, height 4' 7.5" (1.41 m), weight 62.6 kg (138 lb), last menstrual period 03/23/2017, SpO2 99 %, unknown if currently breastfeeding.  Physical Exam:  General: alert and cooperative Lochia: appropriate Uterine Fundus: firm Incision:  DVT Evaluation: No evidence of DVT seen on physical exam. Negative Homan's sign. No cords or calf tenderness. No significant calf/ankle edema.  Recent Labs    11/18/17 0342 11/19/17 0600  HGB 12.8 10.5*  HCT 35.3* 29.0*    Assessment/Plan:  4th degree vagial lac  Periurethral edema and difficulty voiding.  1. To DC [place] and observe for 4-6 hrs for voiding 2. Record I/Os to ensure proper voiding. 3.Home tomorrow if no issues. 4. Monitor BP 5. Encourage ambulation.    LOS: 1 day   Justus Memory 11/19/2017, 9:13 AM

## 2017-11-19 NOTE — Discharge Instructions (Signed)
Care of a Perineal Tear A perineal tear is a cut (laceration) in the tissue between the opening of the vagina and the anus (perineum). Some women naturally develop a perineal tear during a vaginal birth. This can happen as the baby emerges from the birth canal and the perineum is stretched. Perineal tears are graded based on how deep and long the laceration is. The grading for perineal tears is as follows:  First degree. This involves a shallow tear at the edge of the vaginal opening that extends slightly into the perineal skin.  Second degree. This involves tearing described in a first degree perineal tear and also a deeper tear of the vaginal opening and perineal tissues. It may also include tearing of a muscle just under the perineal skin.  Third degree. This involves tearing described in a first and second degree perineal tear, with the tear extending into the muscle of the anus (anal sphincter).  Fourth degree. This involves all levels of tear described for first, second, and third degree perineal tear, with the tear extending into the rectum.  First degree perineal tears may or may not be stitched closed, depending on their location and appearance. Second, third, and fourth degree perineal tears are stitched closed immediately after the babys birth. What are the risks? Depending on the type of perineal tear you have, you may be at risk for the following:  Bleeding.  Developing a collection of blood in the perineal tear area (hematoma).  Pain. This may include pain with urination or bowel movements.  Infection at the site of the tear.  Fever.  Trouble controlling your bowels (fecal incontinence).  Painful sexual intercourse.  How to care for a perineal tear  The first day, put ice on the area of the tear. ? Put ice in a plastic bag. ? Place a towel between your skin and the bag. ? Leave the ice on for 20 minutes, 2-3 times a day.  Bathe using a warm sitz bath as directed by  your health care provider. This can speed up healing. Sitz baths can be performed in your bathtub or using a sitz bath kit that fits over your toilet. ? Place 3-4 in. (7.6-10 cm) of warm water in your bathtub or fill the sitz bath over-the-toilet container with warm water. Make sure the water is not too hot by placing a drop on your wrist. ? Sit in the warm water for 20-30 minutes. ? After bathing, pat your perineum dry with a clean towel. Do not scrub the perineum as this could cause pain, irritation, or open any stitches you may have. ? Keep the over-the-toilet sitz bath container clean by rinsing it thoroughly after each use. Ask for help in keeping the bathtub clean with diluted bleach and water (2 Tbsp [30 mL] of bleach to  gal [1.9 L] of water). ? Repeat the sitz bath as often as you would like to relieve perineal pain, itching, or discomfort.  Apply a numbing spray to the perineal tear site as directed by your health care provider. This may help with discomfort.  Wash your hands before and after applying medicine to the area.  Put about 3 witch hazel-containing hemorrhoid treatment pads on top of your sanitary pad. The witch hazel in the hemorrhoid pads helps with discomfort and swelling.  Get a squeeze bottle to squeeze warm water on your perineum when urinating, spraying the area from front to back. Pat the area to dry it.  Sitting on an inflatable  ring or pillow may provide comfort.  Take medicines only as directed by your health care provider.  Do not have sexual intercourse or use tampons until your health care provider says it is okay. Typically, you must wait at least 6 weeks.  Keep all postpartum appointments as directed by your health care provider. Contact a health care provider if:  Your pain is not relieved with medicines.  You have painful urination.  You have a fever. Get help right away if:  You have redness, swelling, or increasing pain in the area of the  tear.  You have pus coming from the area of the tear.  You notice a bad smell coming from the area of the tear.  Your tear opens.  You notice swelling in the area of the tear that is larger than when you left the hospital.  You cannot urinate. This information is not intended to replace advice given to you by your health care provider. Make sure you discuss any questions you have with your health care provider. Document Released: 09/04/2013 Document Revised: 10/02/2015 Document Reviewed: 01/24/2013 Elsevier Interactive Patient Education  2017 [employer].

## 2017-11-23 ENCOUNTER — Ambulatory Visit (HOSPITAL_COMMUNITY): Payer: Medicaid Other

## 2017-11-25 ENCOUNTER — Encounter: Payer: Medicaid Other | Admitting: Student

## 2017-11-26 ENCOUNTER — Encounter (HOSPITAL_COMMUNITY): Payer: Self-pay | Admitting: *Deleted

## 2017-11-26 ENCOUNTER — Inpatient Hospital Stay (HOSPITAL_COMMUNITY)
Admission: AD | Admit: 2017-11-26 | Discharge: 2017-11-26 | Disposition: A | Discharge status: Home / Self Care | Payer: Medicaid Other | Source: Ambulatory Visit | Attending: Obstetrics and Gynecology | Admitting: Obstetrics and Gynecology

## 2017-11-26 DIAGNOSIS — Z8632 Personal history of gestational diabetes: Secondary | ICD-10-CM | POA: Insufficient documentation

## 2017-11-26 DIAGNOSIS — O9089 Other complications of the puerperium, not elsewhere classified: Secondary | ICD-10-CM | POA: Diagnosis not present

## 2017-11-26 DIAGNOSIS — K5900 Constipation, unspecified: Secondary | ICD-10-CM | POA: Insufficient documentation

## 2017-11-26 DIAGNOSIS — R102 Pelvic and perineal pain: Secondary | ICD-10-CM | POA: Insufficient documentation

## 2017-11-26 LAB — URINALYSIS, ROUTINE W REFLEX MICROSCOPIC
Bilirubin Urine: NEGATIVE
GLUCOSE, UA: NEGATIVE mg/dL
Ketones, ur: NEGATIVE mg/dL
NITRITE: NEGATIVE
PROTEIN: NEGATIVE mg/dL
SPECIFIC GRAVITY, URINE: 1.009 (ref 1.005–1.030)
WBC, UA: >50 WBC/hpf — ABNORMAL HIGH (ref 0–5)
pH: 7 (ref 5.0–8.0)

## 2017-11-26 MED ORDER — DOCUSATE SODIUM 100 MG PO CAPS: 100.0000 mg | ORAL_CAPSULE | Freq: Two times a day (BID) | ORAL | 0 refills | Status: AC

## 2017-11-26 MED ORDER — BENZOCAINE-MENTHOL 20-0.5 % EX AERO
1.0000 "application " | INHALATION_SPRAY | Freq: Once | CUTANEOUS | Status: AC
  Administered 2017-11-26: 1 via TOPICAL
  Filled 2017-11-26: qty 56

## 2017-11-26 MED ORDER — BENZOCAINE-MENTHOL 20-0.5 % EX AERO: 1.0000 "application " | INHALATION_SPRAY | Freq: Four times a day (QID) | CUTANEOUS | Status: DC | PRN

## 2017-11-26 NOTE — MAU Provider Note (Signed)
Chief Complaint: Vaginal Pain   First Provider Initiated Contact with Patient 11/26/17 1627     SUBJECTIVE HPI: Claire Hughes is a 32 y.o. [id number] at 1 week s/p SVD who presents to [facility] Admissions reporting vaginal/rectal pain. She had a 4th degree perineal laceration that was repaired. Discharged home with ibuprofen, percocet & sent home with bottle of dermoplast. States she ran out of the dermoplast and her perineal pain has bene worsening. Pain worse when she tries to have a bowel movement or flatus. Last BM was on Wednesday. Not taking anything to prevent constipation. Denies fever/chills. No bowel incontinence. Also feels like she has a hemorrhoid.   Location: perineum Quality: throbbing Severity: 5/10 on pain scale Duration: 1 week Timing: constant Modifying factors: worse with sitting & BMs Associated signs and symptoms: none  Past Medical History:  Diagnosis Date  . Diabetes mellitus without complication ([place])   . Gestational diabetes   . Malaria 1994   as a child in [place]   . Medical history non-contributory    OB History  Gravida Para Term Preterm AB Living  6 3 3  0 3 3  SAB TAB Ectopic Multiple Live Births  3 0 0 0 3    # Outcome Date GA Lbr Len/2nd Weight Sex Delivery Anes PTL Lv  6 Term 11/18/17 [redacted]w[redacted]d 53:26 / 00:29 6 lb 7.5 oz (2.934 kg) M Vag-Spont EPI, Local  LIV  5 SAB 2013 [redacted]w[redacted]d         4 Term 2012 [redacted]w[redacted]d  5 lb 11.7 oz (2.6 kg) M Vag-Spont     3 Term 2009 [redacted]w[redacted]d  7 lb 2.6 oz (3.25 kg) M Vag-Spont     2 SAB           1 SAB            Past Surgical History:  Procedure Laterality Date  . NO PAST SURGERIES     Social History   Socioeconomic History  . Marital status: Married    Spouse name: Glendora Score  . Number of children: 2  . Years of education: 6  . Highest education level: Not on file  Occupational History  . Occupation: Housewife  Social Needs  . Financial resource strain: Not on file  . Food insecurity:    Worry: Not on file   Inability: Not on file  . Transportation needs:    Medical: Not on file    Non-medical: Not on file  Tobacco Use  . Smoking status: Never Smoker  . Smokeless tobacco: Never Used  Substance and Sexual Activity  . Alcohol use: No  . Drug use: No  . Sexual activity: Not Currently    Birth control/protection: None    Comment: previous used pill and depo  Lifestyle  . Physical activity:    Days per week: Not on file    Minutes per session: Not on file  . Stress: Not on file  Relationships  . Social connections:    Talks on phone: Not on file    Gets together: Not on file    Attends religious service: Not on file    Active member of club or organization: Not on file    Attends meetings of clubs or organizations: Not on file    Relationship status: Not on file  . Intimate partner violence:    Fear of current or ex partner: Not on file    Emotionally abused: Not on file    Physically abused: Not on file  Forced sexual activity: Not on file  Other Topics Concern  . Not on file  Social History Narrative   Born in [place] in [place] for 6 years   [place]oved [place] in 06/2013.    Lives with husband, two sons, an uncle and Gwyndolyn Saxon Tintuep   Family History  Problem Relation Age of Onset  . Cancer Neg Hx    No current facility-administered medications on file prior to encounter.    Current Outpatient Medications on File Prior to Encounter  Medication Sig Dispense Refill  . oxyCODONE-acetaminophen (PERCOCET/ROXICET) 5-325 MG tablet Take 1 tablet by mouth every 6 (six) hours as needed (For Moderate to severe pain). 30 tablet 0  . ibuprofen (ADVIL,MOTRIN) 600 MG tablet Take 1 tablet (600 mg total) by mouth every 6 (six) hours. 30 tablet 0  . Prenatal Vit-Fe Fumarate-FA (PRENATAL MULTIVITAMIN) TABS tablet Take 1 tablet by mouth daily at 12 noon. 30 tablet 11   No Known Allergies  I have reviewed patient's Past Medical Hx, Surgical Hx, Family Hx, Social Hx, medications  and allergies.   Review of Systems  Constitutional: Negative for chills and fever.  Gastrointestinal: Positive for constipation. Negative for abdominal pain and blood in stool.  Genitourinary: Positive for vaginal bleeding and vaginal pain. Negative for dysuria and vaginal discharge.    OBJECTIVE Patient Vitals for the past 24 hrs:  BP Temp Temp src Pulse Resp SpO2 Height Weight  11/26/17 1719 97/61 - - 72 - - - -  11/26/17 1247 115/70 98.8 F (37.1 C) Oral 79 16 100 % 4\' 8"  (1.422 m) 130 lb (59 kg)   Constitutional: Well-developed, well-nourished female in no acute distress.  Cardiovascular: normal rate & rhythm, no murmur Respiratory: normal rate and effort. Lung sounds clear throughout GI: Abd soft, non-tender, Pos BS x 4. No guarding or rebound tenderness MS: Extremities nontender, no edema, normal ROM Neurologic: Alert and oriented x 4.  GU:   Perineal laceration intact & well healing. No defects felt during palpation of posterior vaginal wall. No  erythema or foul discharge. ~1cm flesh colored tissue protruding from bottom of repair  LAB RESULTS Results for orders placed or performed during the hospital encounter of 11/26/17 (from the past 24 hour(s))  Urinalysis, Routine w reflex microscopic     Status: Abnormal   Collection Time: 11/26/17  1:10 PM  Result Value Ref Range   Color, Urine YELLOW YELLOW   APPearance HAZY (A) CLEAR   Specific Gravity, Urine 1.009 1.005 - 1.030   pH 7.0 5.0 - 8.0   Glucose, UA NEGATIVE NEGATIVE mg/dL   Hgb urine dipstick LARGE (A) NEGATIVE   Bilirubin Urine NEGATIVE NEGATIVE   Ketones, ur NEGATIVE NEGATIVE mg/dL   Protein, ur NEGATIVE NEGATIVE mg/dL   Nitrite NEGATIVE NEGATIVE   Leukocytes, UA LARGE (A) NEGATIVE   RBC / HPF 0-5 0 - 5 RBC/hpf   WBC, UA >50 (H) 0 - 5 WBC/hpf   Bacteria, UA MANY (A) NONE SEEN   Squamous Epithelial / LPF 0-5 0 - 5   WBC Clumps PRESENT    Mucus PRESENT     IMAGING No results found.  MAU COURSE Orders  Placed This Encounter  Procedures  . Urinalysis, Routine w reflex microscopic  . Discharge patient   Meds ordered this encounter  Medications  . DISCONTD: benzocaine-Menthol (DERMOPLAST) 20-0.5 % topical spray 1 application  . benzocaine-Menthol (DERMOPLAST) 20-0.5 % topical spray 1 application  . docusate sodium (COLACE) 100 MG  capsule    Sig: Take 1 capsule (100 mg total) by mouth 2 (two) times daily.    Dispense:  60 capsule    Refill:  0    Order Specific Question:   Supervising Provider    Answer:   Aletha Halim [[id number]]    MDM No evidence of infection or dehiscence Pt afebrile Dermoplast applied prior to discharge and patient sent home with remainder Discussed prevention of constipation -- will rx colace & decrease use of percocet Msg to office for f/u to reassess prior to PP visit  ASSESSMENT 1. Postpartum perineal pain   2. Laceration, obstetrical, fourth degree     PLAN Discharge home in stable condition.   Allergies as of 11/26/2017   No Known Allergies     Medication List    TAKE these medications   docusate sodium 100 MG capsule Commonly known as:  COLACE Take 1 capsule (100 mg total) by mouth 2 (two) times daily.   ibuprofen 600 MG tablet Commonly known as:  ADVIL,MOTRIN Take 1 tablet (600 mg total) by mouth every 6 (six) hours.   oxyCODONE-acetaminophen 5-325 MG tablet Commonly known as:  PERCOCET/ROXICET Take 1 tablet by mouth every 6 (six) hours as needed (For Moderate to severe pain).   prenatal multivitamin Tabs tablet Take 1 tablet by mouth daily at 12 noon.        Jorje Guild, NP 11/26/2017  9:42 PM

## 2017-11-26 NOTE — Discharge Instructions (Signed)
Care of a Perineal Tear °A perineal tear is a cut (laceration) in the tissue between the opening of the vagina and the anus (perineum). Some women naturally develop a perineal tear during a vaginal birth. This can happen as the baby emerges from the birth canal and the perineum is stretched. Perineal tears are graded based on how deep and long the laceration is. The grading for perineal tears is as follows: °· First degree. This involves a shallow tear at the edge of the vaginal opening that extends slightly into the perineal skin. °· Second degree. This involves tearing described in a first degree perineal tear and also a deeper tear of the vaginal opening and perineal tissues. It may also include tearing of a muscle just under the perineal skin. °· Third degree. This involves tearing described in a first and second degree perineal tear, with the tear extending into the muscle of the anus (anal sphincter). °· Fourth degree. This involves all levels of tear described for first, second, and third degree perineal tear, with the tear extending into the rectum. ° °First degree perineal tears may or may not be stitched closed, depending on their location and appearance. Second, third, and fourth degree perineal tears are stitched closed immediately after the baby’s birth. °What are the risks? °Depending on the type of perineal tear you have, you may be at risk for the following: °· Bleeding. °· Developing a collection of blood in the perineal tear area (hematoma). °· Pain. This may include pain with urination or bowel movements. °· Infection at the site of the tear. °· Fever. °· Trouble controlling your bowels (fecal incontinence). °· Painful sexual intercourse. ° °How to care for a perineal tear °· The first day, put ice on the area of the tear. °? Put ice in a plastic bag. °? Place a towel between your skin and the bag. °? Leave the ice on for 20 minutes, 2-3 times a day. °· Bathe using a warm sitz bath as directed by  your health care provider. This can speed up healing. Sitz baths can be performed in your bathtub or using a sitz bath kit that fits over your toilet. °? Place 3-4 in. (7.6-10 cm) of warm water in your bathtub or fill the sitz bath over-the-toilet container with warm water. Make sure the water is not too hot by placing a drop on your wrist. °? Sit in the warm water for 20-30 minutes. °? After bathing, pat your perineum dry with a clean towel. Do not scrub the perineum as this could cause pain, irritation, or open any stitches you may have. °? Keep the over-the-toilet sitz bath container clean by rinsing it thoroughly after each use. Ask for help in keeping the bathtub clean with diluted bleach and water (2 Tbsp [30 mL] of bleach to ½ gal [1.9 L] of water). °? Repeat the sitz bath as often as you would like to relieve perineal pain, itching, or discomfort. °· Apply a numbing spray to the perineal tear site as directed by your health care provider. This may help with discomfort. °· Wash your hands before and after applying medicine to the area. °· Put about 3 witch hazel-containing hemorrhoid treatment pads on top of your sanitary pad. The witch hazel in the hemorrhoid pads helps with discomfort and swelling. °· Get a squeeze bottle to squeeze warm water on your perineum when urinating, spraying the area from front to back. Pat the area to dry it. °· Sitting on an inflatable   ring or pillow may provide comfort. °· Take medicines only as directed by your health care provider. °· Do not have sexual intercourse or use tampons until your health care provider says it is okay. Typically, you must wait at least 6 weeks. °· Keep all postpartum appointments as directed by your health care provider. °Contact a health care provider if: °· Your pain is not relieved with medicines. °· You have painful urination. °· You have a fever. °Get help right away if: °· You have redness, swelling, or increasing pain in the area of the  tear. °· You have pus coming from the area of the tear. °· You notice a bad smell coming from the area of the tear. °· Your tear opens. °· You notice swelling in the area of the tear that is larger than when you left the hospital. °· You cannot urinate. °This information is not intended to replace advice given to you by your health care provider. Make sure you discuss any questions you have with your health care provider. °Document Released: 09/04/2013 Document Revised: 10/02/2015 Document Reviewed: 01/24/2013 °Elsevier Interactive Patient Education © 2017 [employer] ° °

## 2017-11-26 NOTE — MAU Note (Addendum)
Pt reports pain in lower abd, had c/s one week ago.

## 2017-12-01 ENCOUNTER — Telehealth: Payer: Self-pay | Admitting: General Practice

## 2017-12-01 NOTE — Telephone Encounter (Signed)
Called and notified patient (with interpreter) of appointment for 12/15/17 at 3:40pm.  Patient verbalized understanding.

## 2017-12-14 ENCOUNTER — Ambulatory Visit: Payer: Medicaid Other | Admitting: Obstetrics & Gynecology

## 2017-12-15 ENCOUNTER — Ambulatory Visit (INDEPENDENT_AMBULATORY_CARE_PROVIDER_SITE_OTHER): Payer: Medicaid Other | Admitting: Obstetrics & Gynecology

## 2017-12-15 ENCOUNTER — Other Ambulatory Visit (HOSPITAL_COMMUNITY)
Admission: RE | Admit: 2017-12-15 | Discharge: 2017-12-15 | Disposition: A | Discharge status: Home / Self Care | Payer: Medicaid Other | Source: Ambulatory Visit | Attending: Obstetrics & Gynecology | Admitting: Obstetrics & Gynecology

## 2017-12-15 ENCOUNTER — Encounter: Payer: Self-pay | Admitting: Obstetrics & Gynecology

## 2017-12-15 VITALS — BP 111/70 | HR 66 | Wt 122.1 lb

## 2017-12-15 DIAGNOSIS — Z0000 Encounter for general adult medical examination without abnormal findings: Secondary | ICD-10-CM | POA: Insufficient documentation

## 2017-12-15 DIAGNOSIS — Z1389 Encounter for screening for other disorder: Secondary | ICD-10-CM | POA: Diagnosis not present

## 2017-12-15 NOTE — Progress Notes (Signed)
Subjective:     Claire Hughes is a 32 y.o. female who presents for a postpartum visit. She is 4 weeks postpartum following a spontaneous vaginal delivery. I have fully reviewed the prenatal and intrapartum course. The delivery was at 9 5/7 gestational weeks. Outcome: spontaneous vaginal delivery. Anesthesia: none. Postpartum course has been normal although she did have to go the the MAU for rectal pain. Baby's course has been normal . Baby is feeding by both breast and bottle - . Bleeding no bleeding. Bowel function is abnormal: some constipation . Bladder function is normal. Patient is not sexually active. Contraception method is condoms. Postpartum depression screening: negative.  The following portions of the patient's history were reviewed and updated as appropriate: allergies, current medications, past family history, past medical history, past social history, past surgical history and problem list.  Review of Systems Pertinent items are noted in HPI.   Objective:    There were no vitals taken for this visit.  General:  alert   Breasts:  inspection negative, no nipple discharge or bleeding, no masses or nodularity palpable  Lungs: clear to auscultation bilaterally  Heart:  regular rate and rhythm, S1, S2 normal, no murmur, click, rub or gallop  Abdomen: soft, non-tender; bowel sounds normal; no masses,  no organomegaly   Vulva:  normal  Vagina: normal vagina  Cervix:  anteverted  Corpus: normal  Adnexa:  normal adnexa  Rectal Exam: Normal rectovaginal exam, very thin layer between rectum and vagina        Assessment:     Normal  postpartum exam. Pap smear done at today's visit.   Plan:    1. Contraception: condoms 2. Rec abstinence for another 2-4 weeks 3. Follow up in: 2 week for 2 hour GTT or as needed.

## 2017-12-20 LAB — CYTOLOGY - PAP
Diagnosis: NEGATIVE
HPV (WINDOPATH): NOT DETECTED

## 2017-12-22 ENCOUNTER — Ambulatory Visit: Payer: Medicaid Other | Admitting: Advanced Practice Midwife

## 2017-12-28 ENCOUNTER — Other Ambulatory Visit: Payer: Self-pay

## 2017-12-28 DIAGNOSIS — O24410 Gestational diabetes mellitus in pregnancy, diet controlled: Secondary | ICD-10-CM

## 2017-12-29 ENCOUNTER — Other Ambulatory Visit: Payer: Medicaid Other

## 2017-12-29 DIAGNOSIS — O24410 Gestational diabetes mellitus in pregnancy, diet controlled: Secondary | ICD-10-CM

## 2017-12-30 LAB — GLUCOSE TOLERANCE, 2 HOURS
GLUCOSE FASTING GTT: 81 mg/dL (ref 65–99)
Glucose, 2 hour: 109 mg/dL (ref 65–139)

## 2019-11-26 IMAGING — US US MFM OB FOLLOW-UP
1 series · 13 of 28 positions shown · non-contrast
Comparison: none

[Series 1: us mfm ob follow-up · 13 of 41 slices shown]
[im 2/41]
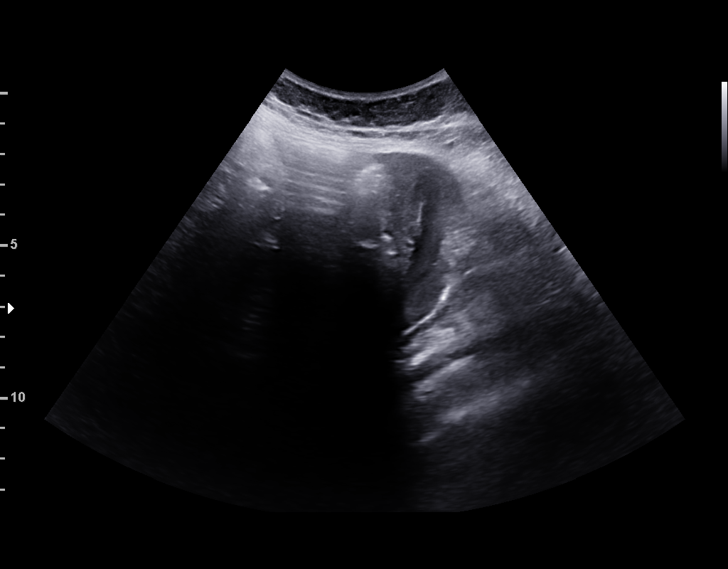
[im 5/41]
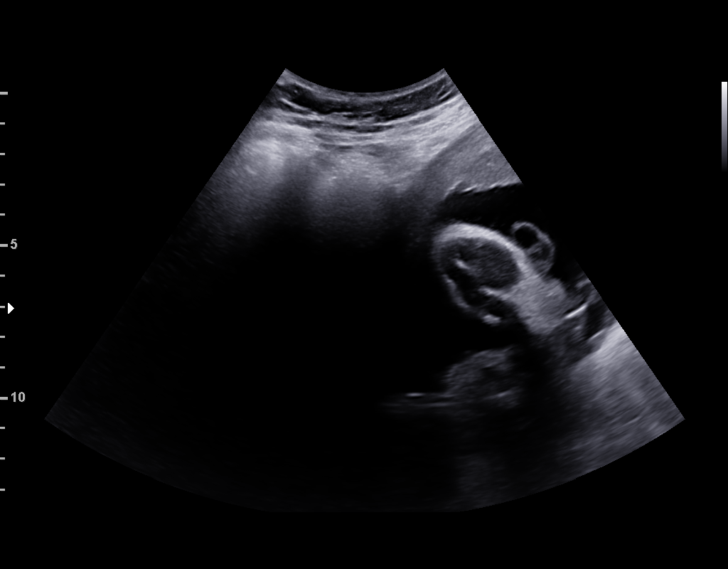
[im 8/41]
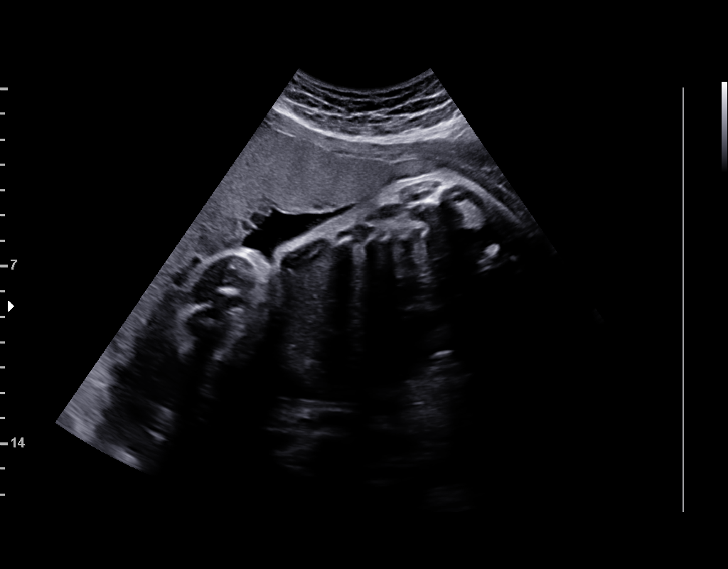
[im 11/41]
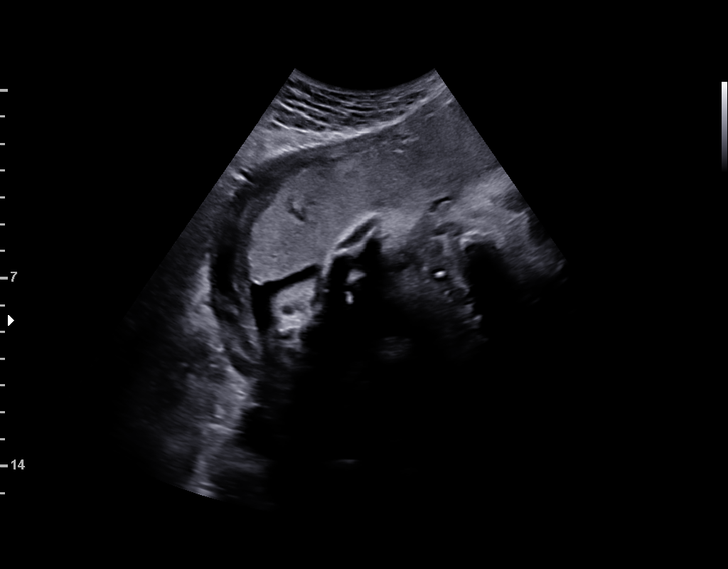
[im 14/41]
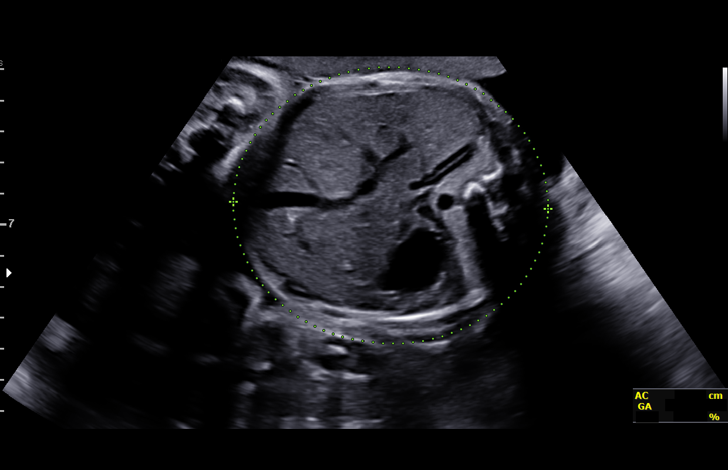
[im 17/41]
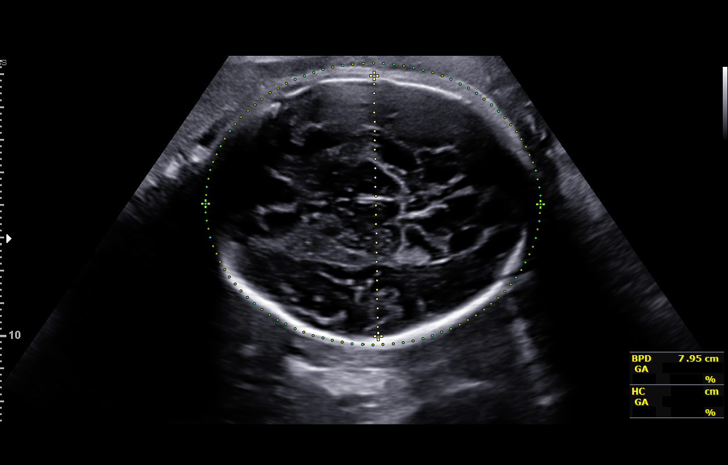
[im 21/41]
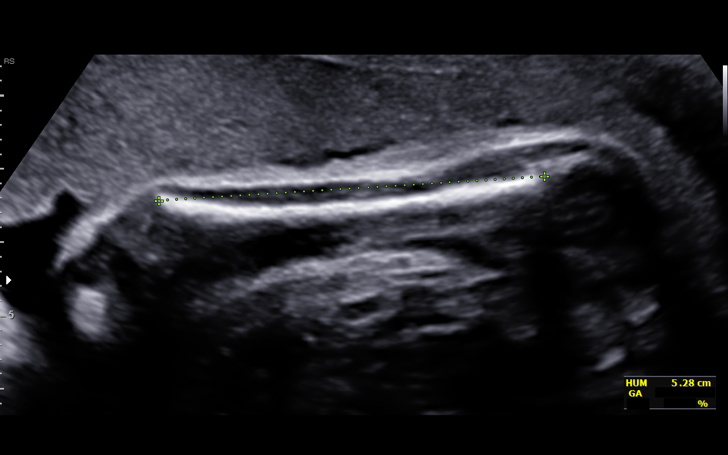
[im 24/41]
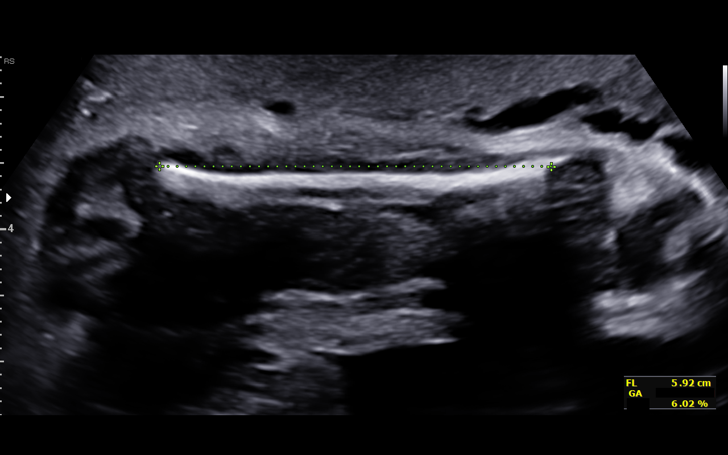
[im 27/41]
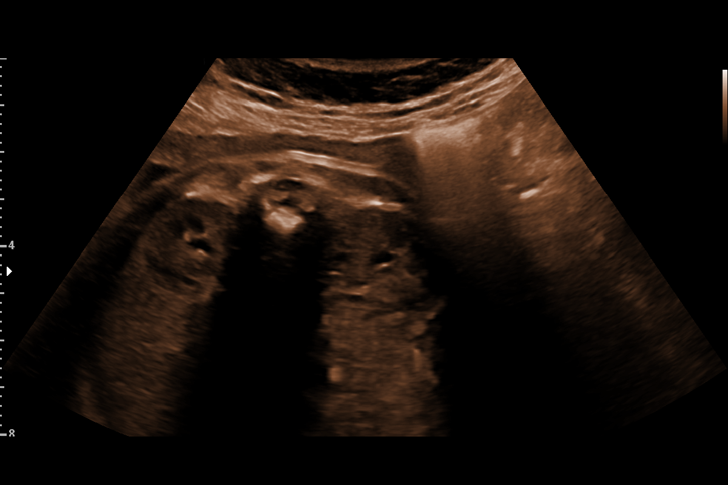
[im 30/41]
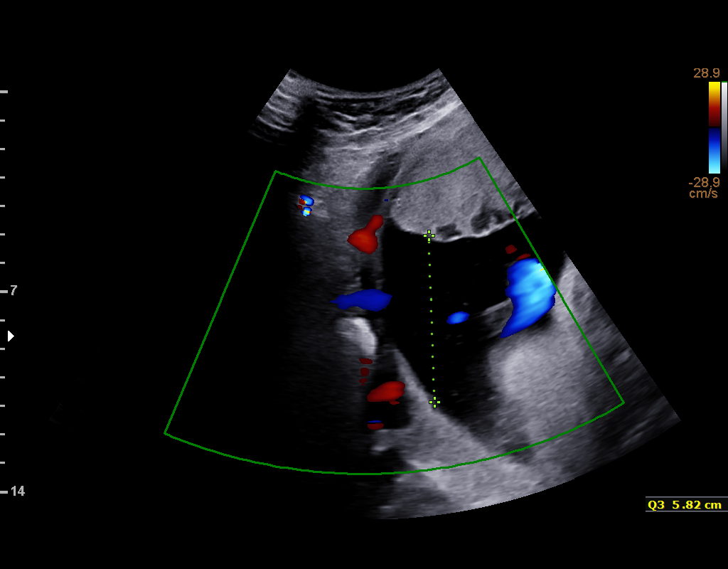
[im 33/41]
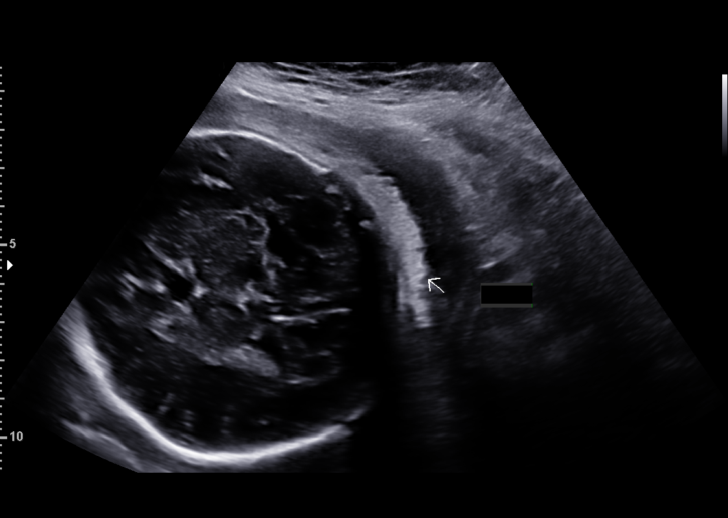
[im 36/41]
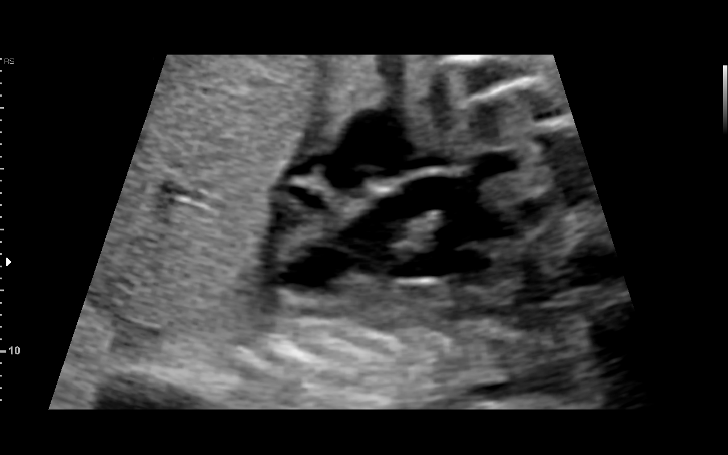
[im 39/41]
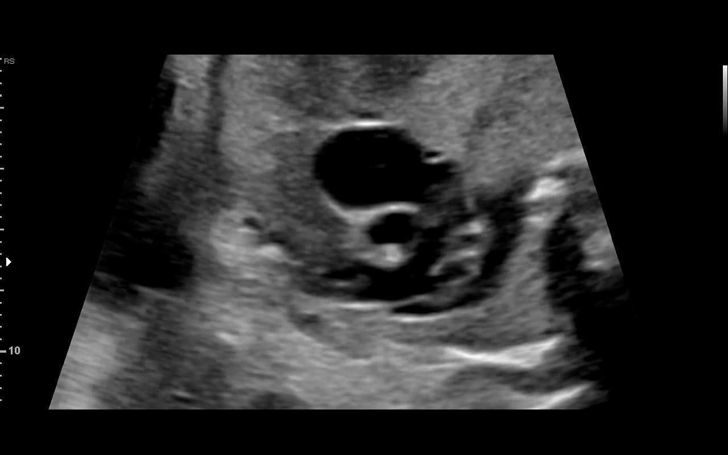

[13 of 28 positions shown; findings below may reference images not displayed]

OB/Gyn Clinic

1  IRWANTO FUADI           [id number]      [id number]     [id number]
Indications

32 weeks gestation of pregnancy
Encounter for other antenatal screening
follow-up (Left CPC vs. hemorrhage)
Gestational diabetes in pregnancy, diet
controlled
OB History

Blood Type:            Height:  4'7"   Weight (lb):  119       BMI:
Gravidity:    6         Term:   2        Prem:   0        SAB:   2
TOP:          1       Ectopic:  0        Living: 2
Fetal Evaluation

Num Of Fetuses:     1
Fetal Heart         145
Rate(bpm):
Cardiac Activity:   Observed
Presentation:       Cephalic
Placenta:           Anterior, above cervical os
P. Cord Insertion:  Previously Visualized

Amniotic Fluid
AFI FV:      Subjectively within normal limits

AFI Sum(cm)     %Tile       Largest Pocket(cm)
13.59           44
RUQ(cm)       RLQ(cm)       LUQ(cm)        LLQ(cm)
4.09
Biometry

BPD:      79.8  mm     G. Age:  32w 0d         27  %    CI:        72.29   %    70 - 86
FL/HC:      19.8   %    19.9 -
HC:      298.6  mm     G. Age:  33w 0d         25  %    HC/AC:      1.00        0.96 -
AC:      299.6  mm     G. Age:  34w 0d         84  %    FL/BPD:     74.1   %    71 - 87
FL:       59.1  mm     G. Age:  30w 6d          6  %    FL/AC:      19.7   %    20 - 24
HUM:        53  mm     G. Age:  30w 6d         21  %

Est. FW:    4323  gm      4 lb 8 oz     61  %
Gestational Age

LMP:           28w 1d        Date:  03/23/17                 EDD:   12/28/17
U/S Today:     32w 3d                                        EDD:   11/28/17
Best:          32w 4d     Det. By:  U/S  (06/23/17)          EDD:   11/27/17
Anatomy

Cranium:               Previously seen        Aortic Arch:            Previously seen
Cavum:                 Previously seen        Ductal Arch:            Previously seen
Ventricles:            Appears normal         Diaphragm:              Previously seen
Choroid Plexus:        Appears normal         Stomach:                Appears normal, left
sided
Cerebellum:            Previously seen        Abdomen:                Appears normal
Posterior Fossa:       Previously seen        Abdominal Wall:         Previously seen
Nuchal Fold:           Previously seen        Cord Vessels:           Previously seen
Face:                  Orbits and profile     Kidneys:                Appear normal
previously seen
Lips:                  Previously seen        Bladder:                Appears normal
Thoracic:              Appears normal         Spine:                  Previously seen
Heart:                 Previously seen        Upper Extremities:      Previously seen
RVOT:                  Appears normal         Lower Extremities:      Previously seen
LVOT:                  Appears normal

Other:  Male gender. Heels and 5th digit previously visualized.
Cervix Uterus Adnexa

Cervix
Normal appearance by transabdominal scan.

Uterus
No abnormality visualized.

Left Ovary
Not visualized.

Right Ovary
Not visualized.

Adnexa:       No abnormality visualized. No adnexal mass
visualized.
Impression

Single living intrauterine pregnancy at 32w 4d.
Cephalic presentation.
Placenta Anterior, above cervical os.
Normal amniotic fluid volume.
Appropriate interval fetal growth.
Normal interval fetal anatomy.
Recommendations

Follow-up ultrasounds as clinically indicated.
